# Patient Record
Sex: Female | Born: 1975 | Hispanic: No | Marital: Married | Smoking: Never smoker
Health system: Southern US, Community
[De-identification: ages and names within clinical notes are randomized; demographics above are authoritative.]

## PROBLEM LIST (undated history)

## (undated) ENCOUNTER — Inpatient Hospital Stay (HOSPITAL_COMMUNITY): Payer: Self-pay

## (undated) DIAGNOSIS — Z141 Cystic fibrosis carrier: Secondary | ICD-10-CM

## (undated) DIAGNOSIS — K029 Dental caries, unspecified: Secondary | ICD-10-CM

## (undated) HISTORY — DX: Cystic fibrosis carrier: Z14.1

## (undated) HISTORY — DX: Dental caries, unspecified: K02.9

---

## 2006-09-07 ENCOUNTER — Ambulatory Visit (HOSPITAL_COMMUNITY): Admission: RE | Admit: 2006-09-07 | Discharge: 2006-09-07 | Payer: Self-pay | Admitting: Family Medicine

## 2007-05-11 ENCOUNTER — Ambulatory Visit (HOSPITAL_COMMUNITY): Admission: RE | Admit: 2007-05-11 | Discharge: 2007-05-11 | Payer: Self-pay | Admitting: Family Medicine

## 2007-06-04 ENCOUNTER — Ambulatory Visit (HOSPITAL_COMMUNITY): Admission: RE | Admit: 2007-06-04 | Discharge: 2007-06-04 | Payer: Self-pay | Admitting: Family Medicine

## 2007-06-11 ENCOUNTER — Inpatient Hospital Stay (HOSPITAL_COMMUNITY): Admission: AD | Admit: 2007-06-11 | Discharge: 2007-06-11 | Payer: Self-pay | Admitting: Family Medicine

## 2007-06-18 ENCOUNTER — Ambulatory Visit (HOSPITAL_COMMUNITY): Admission: RE | Admit: 2007-06-18 | Discharge: 2007-06-18 | Payer: Self-pay | Admitting: Family Medicine

## 2007-10-30 ENCOUNTER — Ambulatory Visit (HOSPITAL_COMMUNITY): Admission: RE | Admit: 2007-10-30 | Discharge: 2007-10-30 | Payer: Self-pay | Admitting: Obstetrics & Gynecology

## 2007-11-20 ENCOUNTER — Ambulatory Visit: Payer: Self-pay | Admitting: Obstetrics & Gynecology

## 2007-11-22 ENCOUNTER — Ambulatory Visit: Payer: Self-pay | Admitting: Gynecology

## 2007-11-22 ENCOUNTER — Inpatient Hospital Stay (HOSPITAL_COMMUNITY): Admission: AD | Admit: 2007-11-22 | Discharge: 2007-11-24 | Payer: Self-pay | Admitting: Obstetrics & Gynecology

## 2007-11-26 ENCOUNTER — Inpatient Hospital Stay (HOSPITAL_COMMUNITY): Admission: AD | Admit: 2007-11-26 | Discharge: 2007-11-26 | Payer: Self-pay | Admitting: Gynecology

## 2007-11-26 ENCOUNTER — Ambulatory Visit: Payer: Self-pay | Admitting: Physician Assistant

## 2007-12-05 ENCOUNTER — Inpatient Hospital Stay (HOSPITAL_COMMUNITY): Admission: AD | Admit: 2007-12-05 | Discharge: 2007-12-05 | Payer: Self-pay | Admitting: Obstetrics & Gynecology

## 2007-12-05 ENCOUNTER — Ambulatory Visit: Payer: Self-pay | Admitting: Physician Assistant

## 2007-12-19 ENCOUNTER — Ambulatory Visit: Payer: Self-pay | Admitting: Obstetrics & Gynecology

## 2007-12-20 ENCOUNTER — Observation Stay (HOSPITAL_COMMUNITY): Admission: EM | Admit: 2007-12-20 | Discharge: 2007-12-21 | Payer: Self-pay | Admitting: Emergency Medicine

## 2007-12-20 ENCOUNTER — Encounter (INDEPENDENT_AMBULATORY_CARE_PROVIDER_SITE_OTHER): Payer: Self-pay | Admitting: Surgery

## 2008-01-14 HISTORY — PX: CHOLECYSTECTOMY: SHX55

## 2009-02-25 IMAGING — US US ABDOMEN COMPLETE
1 series · 14 of 25 positions shown · non-contrast
Comparison: None

CLINICAL DATA: Right upper quadrant pain.

ABDOMEN ULTRASOUND
TECHNIQUE: Complete abdominal ultrasound examination was performed
including evaluation of the liver, gallbladder, bile ducts,
pancreas, kidneys, spleen, IVC, and abdominal aorta.

[Series 1: unknown · 0.34mm/px · 14 of 72 slices shown]
[im 1/72]
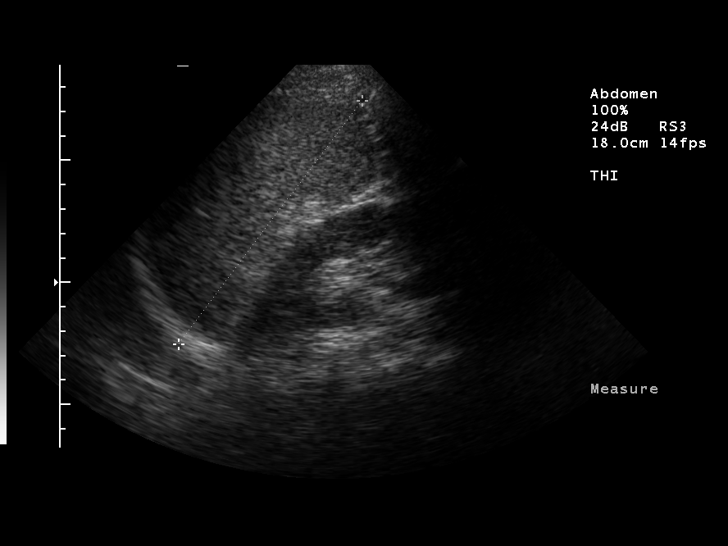
[im 6/72]
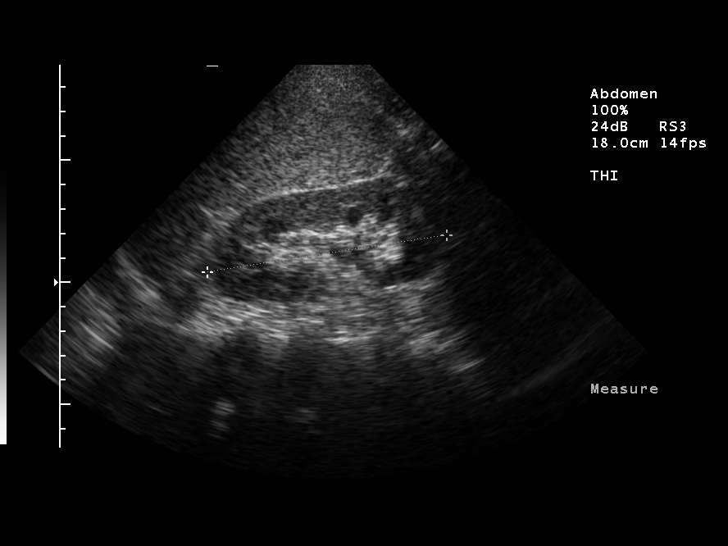
[im 12/72]
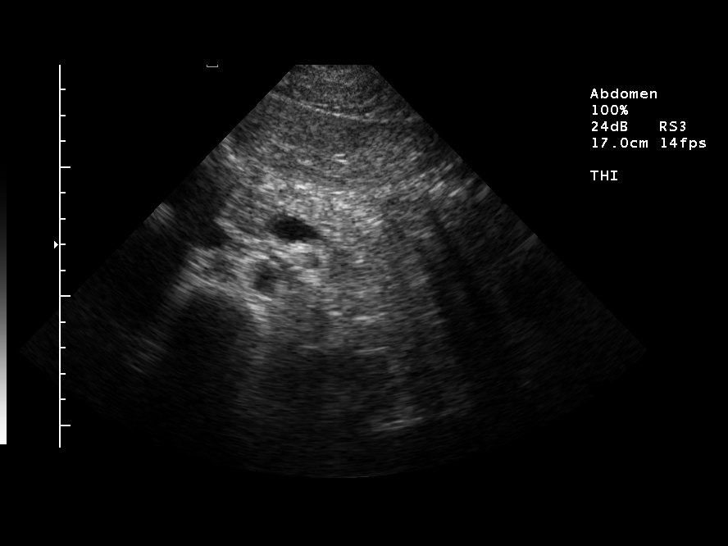
[im 18/72]
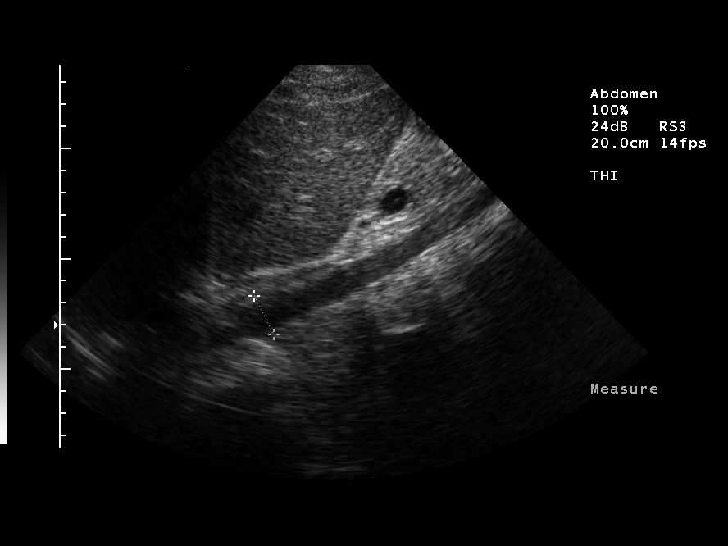
[im 24/72]
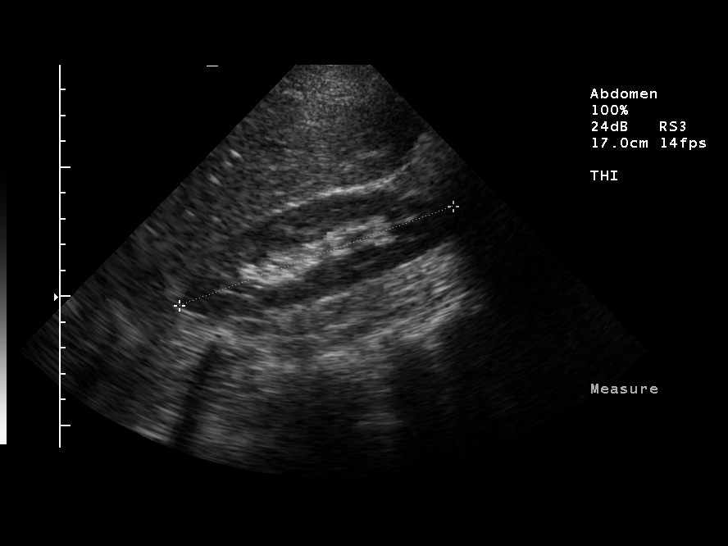
[im 27/72]
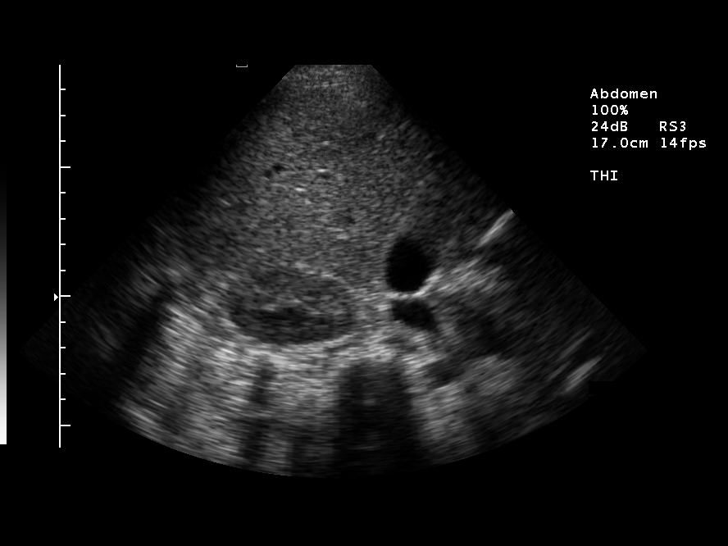
[im 33/72]
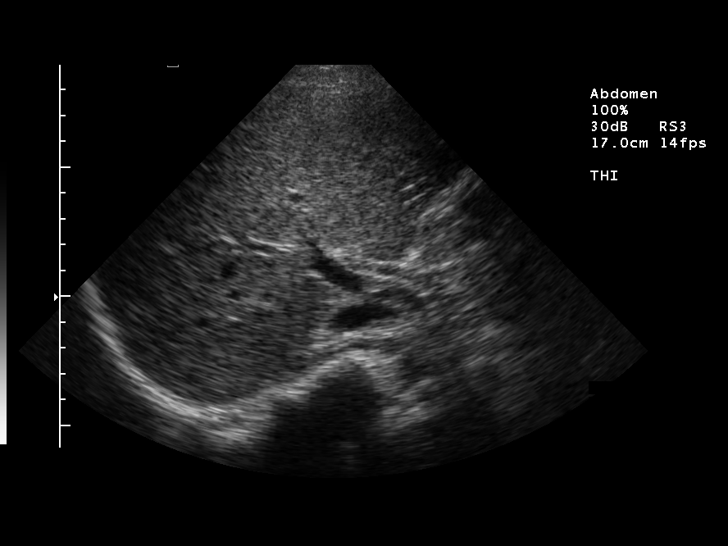
[im 39/72]
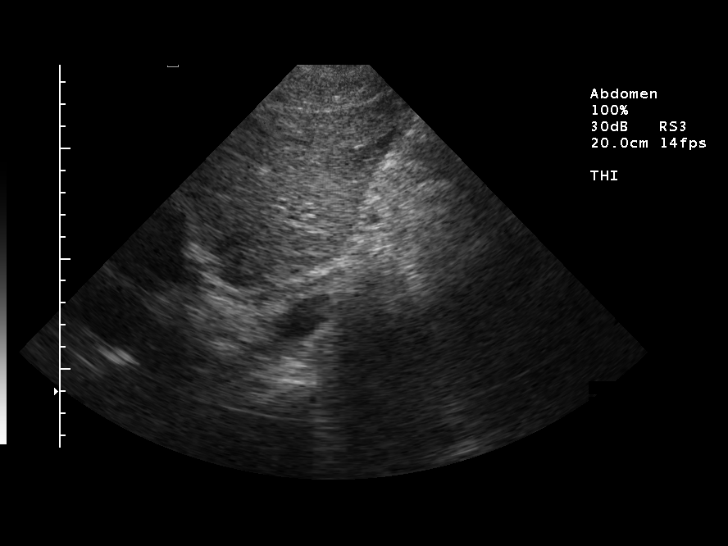
[im 45/72]
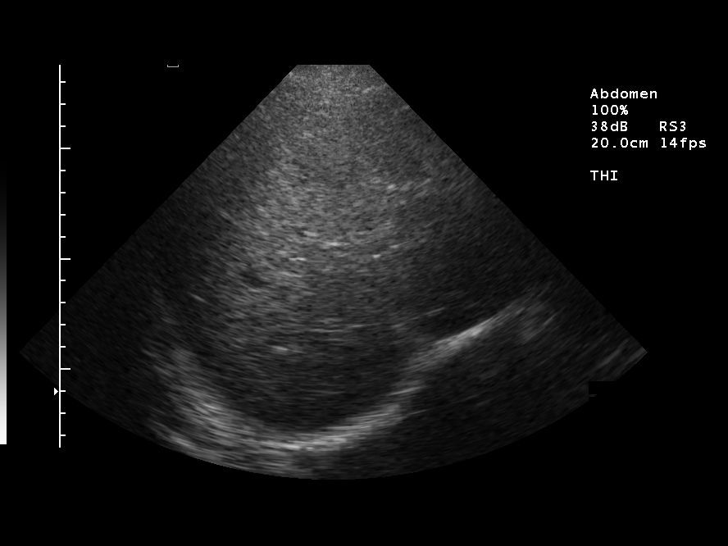
[im 48/72]
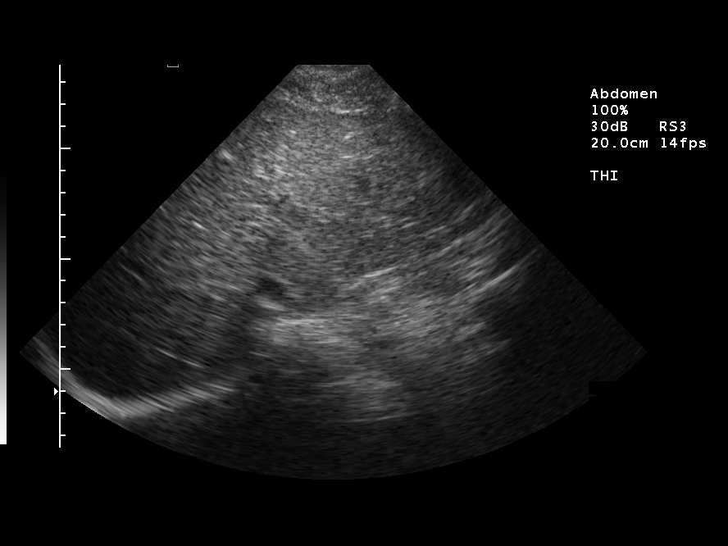
[im 54/72]
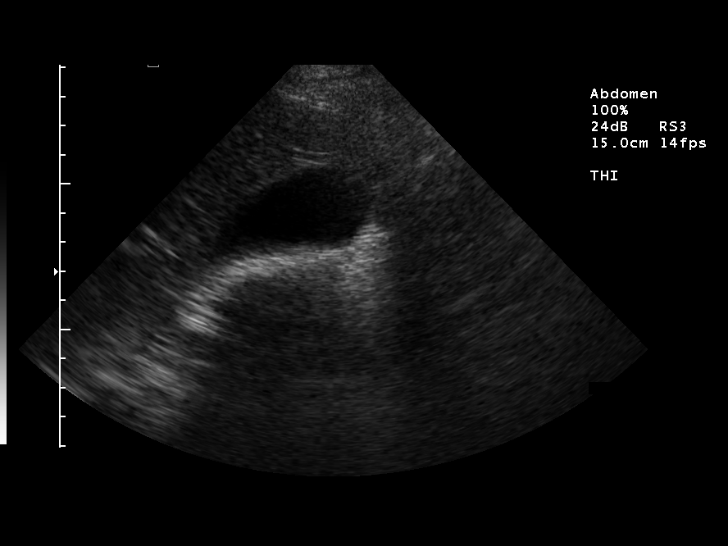
[im 60/72]
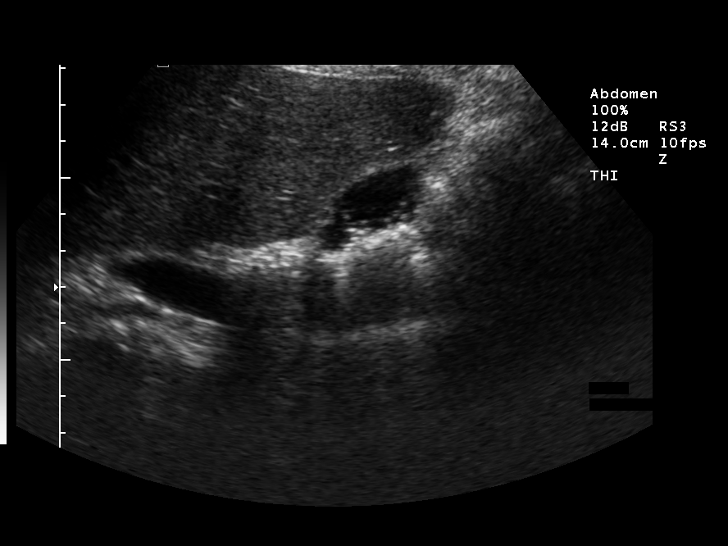
[im 66/72]
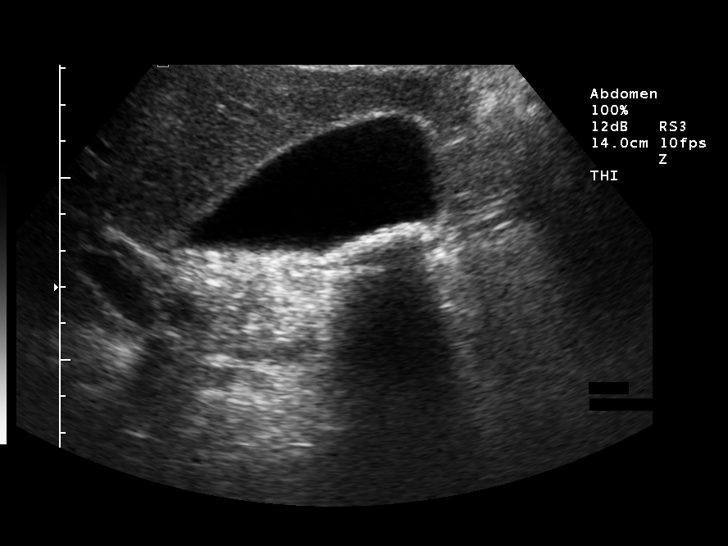
[im 72/72]
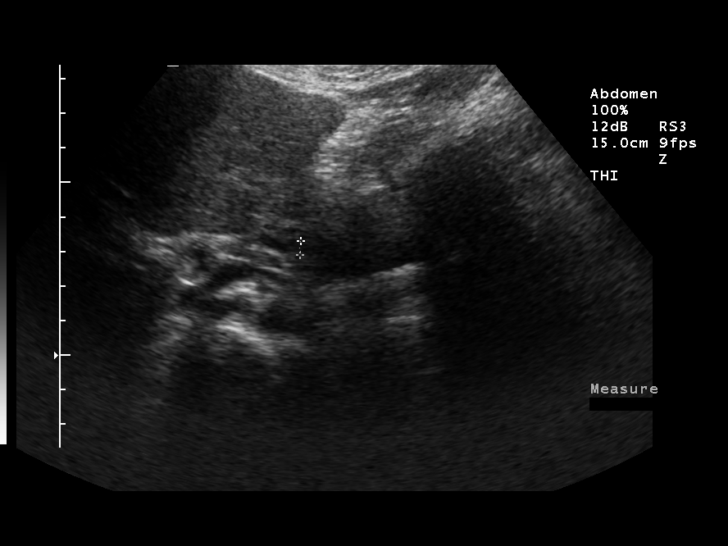

[14 of 25 positions shown; findings below may reference images not displayed]

FINDINGS: Multiple gallstones noted within the gallbladder.  These
are all small, difficult to measure as they are layering
posteriorly.  These are all less than 5 mm in size.  Gallbladder
wall borderline thickened at 3 mm.  The patient was tender over the
gallbladder during the exam.  Common bile duct normal at 5 mm.

Visualized liver, IVC, pancreas, spleen, kidneys, abdominal aorta
unremarkable.
IMPRESSION: Cholelithiasis.  Borderline thickened gallbladder wall with
tenderness over the gallbladder during exam.  Findings worrisome
for early acute cholecystitis.

## 2009-02-25 IMAGING — RF DG CHOLANGIOGRAM OPERATIVE
1 series · 8 of 8 positions shown · non-contrast
Comparison: None

CLINICAL DATA: Pancreatitis and cholelithiasis

INTRAOPERATIVE CHOLANGIOGRAM
TECHNIQUE: Multiple fluoroscopic spot radiographs were obtained
during intraoperative cholangiogram and are submitted for
interpretation post-operatively.

[Series 1: run · 2 acquisitions, 8 frames shown]
[im 1/2]
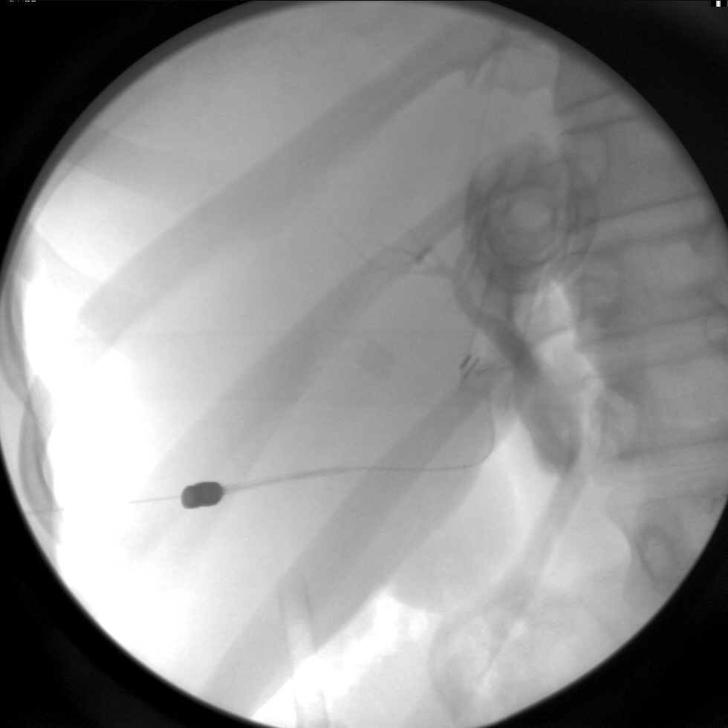
[im 1/2]
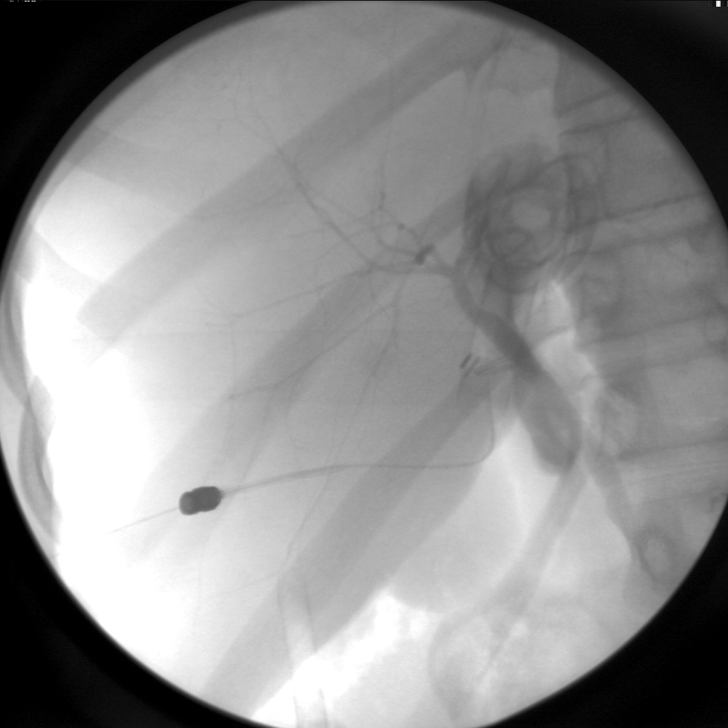
[im 1/2]
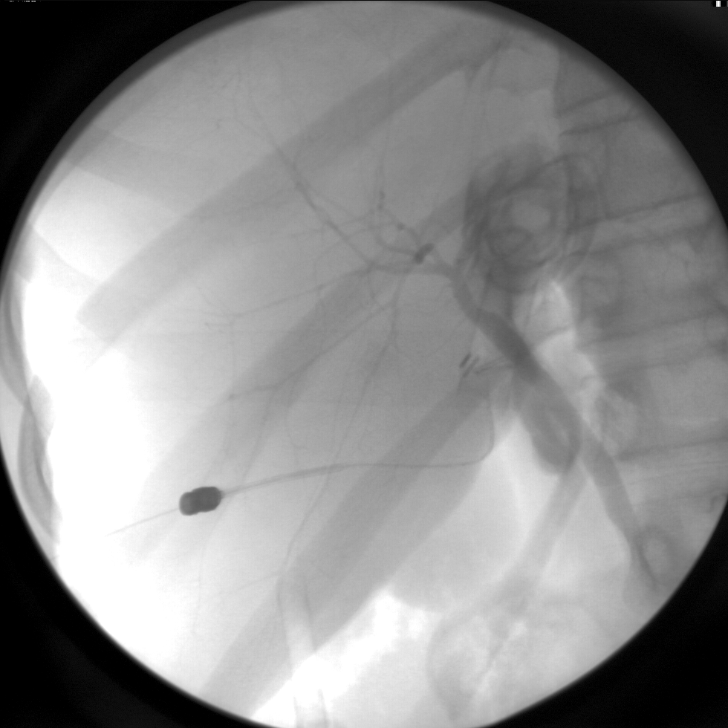
[im 1/2]
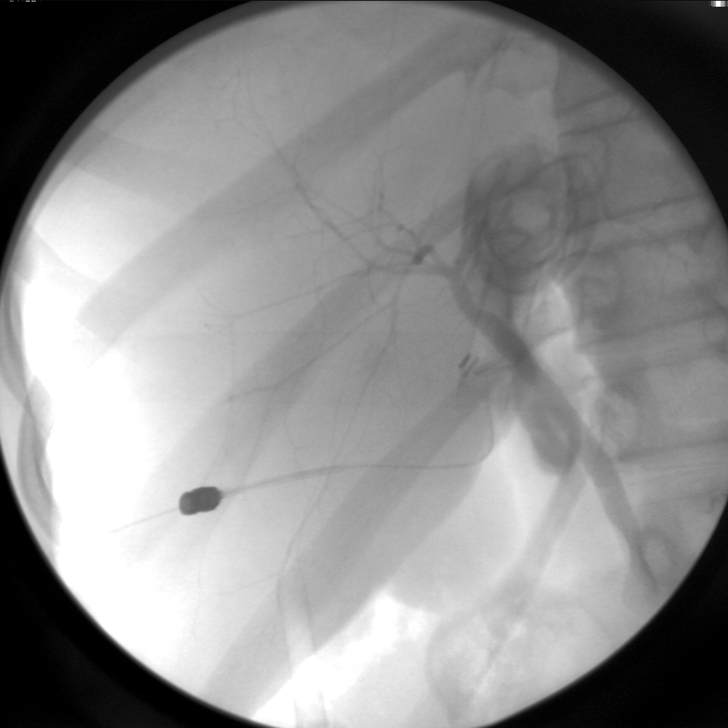
[im 2/2]
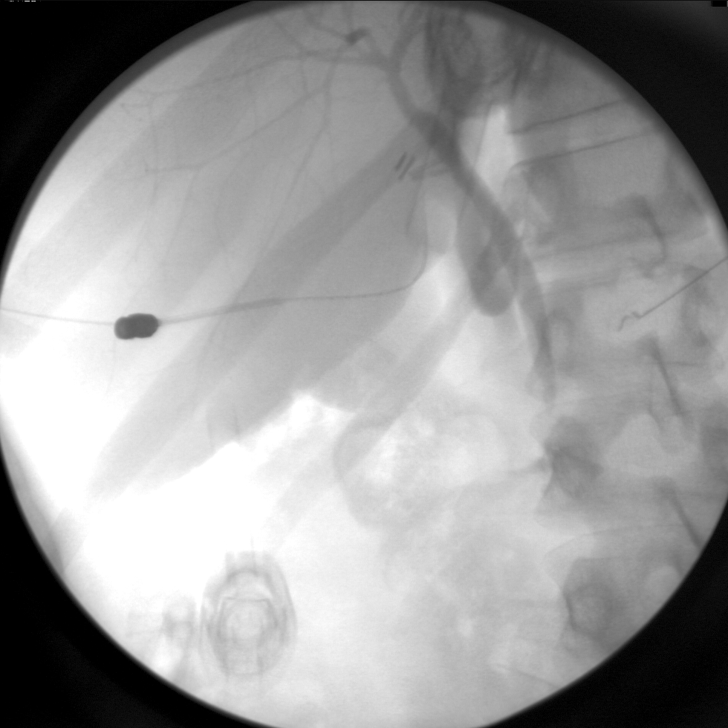
[im 2/2]
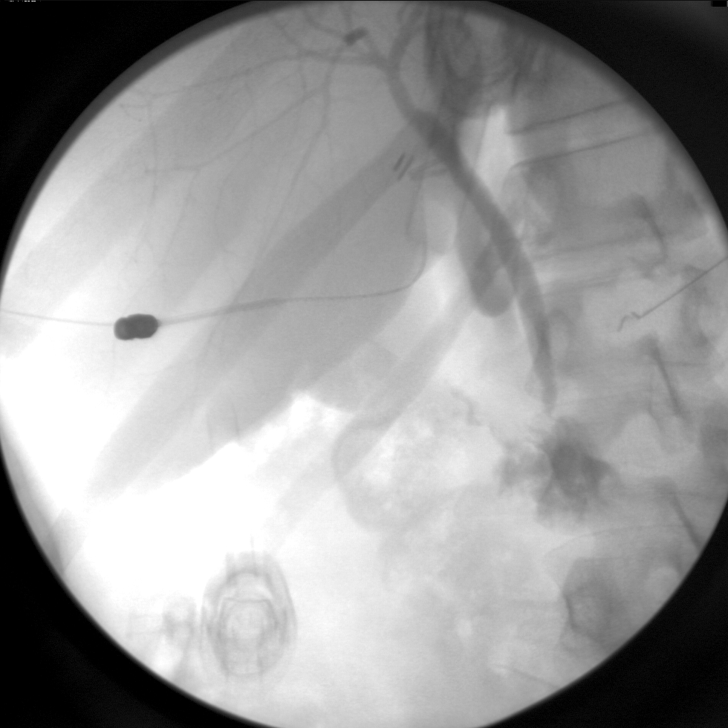
[im 2/2]
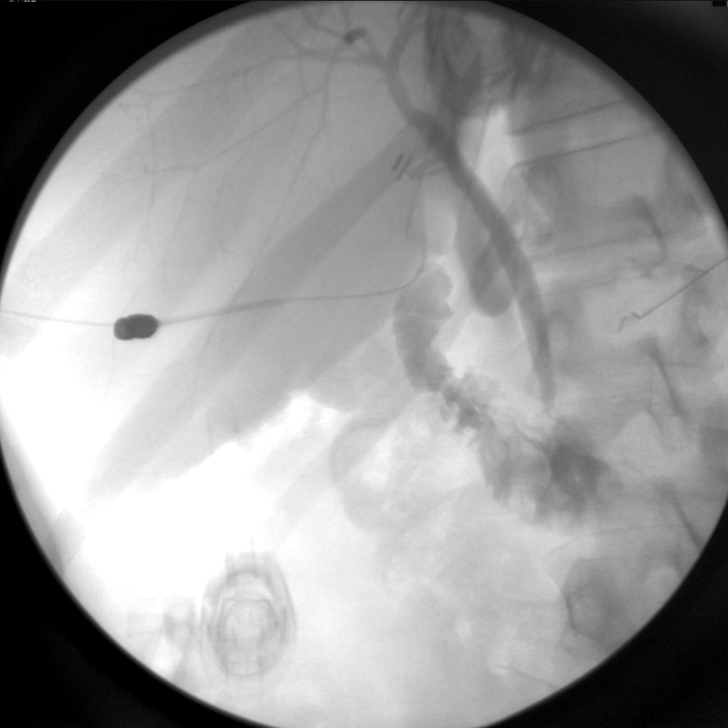
[im 2/2]
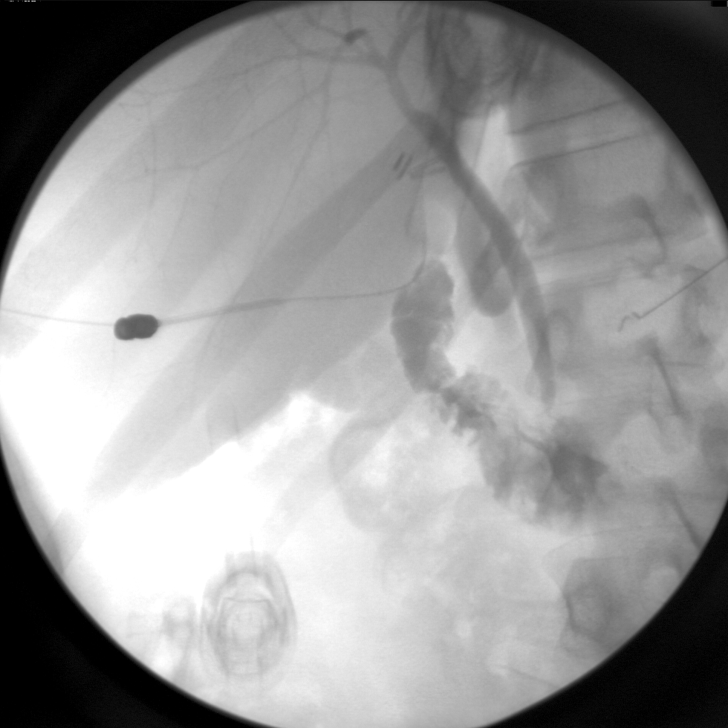

[8 of 8 positions shown; findings below may reference images not displayed]

FINDINGS: Contrast is seen filling the biliary tree and duodenum
compatible with patency.  No filling defects are seen in the common
duct to suggest bile duct stones.
IMPRESSION: Biliary system is patent.  No duct stones.

## 2010-02-09 ENCOUNTER — Encounter (INDEPENDENT_AMBULATORY_CARE_PROVIDER_SITE_OTHER): Payer: Self-pay | Admitting: *Deleted

## 2010-02-09 LAB — CONVERTED CEMR LAB
ALT: 8 units/L (ref 0–35)
Alkaline Phosphatase: 77 units/L (ref 39–117)
Basophils Absolute: 0 10*3/uL (ref 0.0–0.1)
Creatinine, Ser: 0.82 mg/dL (ref 0.40–1.20)
Eosinophils Absolute: 0.2 10*3/uL (ref 0.0–0.7)
Eosinophils Relative: 3 % (ref 0–5)
Glucose, Bld: 93 mg/dL (ref 70–99)
HCT: 44.9 % (ref 36.0–46.0)
LDL Cholesterol: 87 mg/dL (ref 0–99)
MCHC: 33.6 g/dL (ref 30.0–36.0)
MCV: 94.3 fL (ref 78.0–100.0)
Platelets: 231 10*3/uL (ref 150–400)
RDW: 12.2 % (ref 11.5–15.5)
Sodium: 139 meq/L (ref 135–145)
Total Bilirubin: 1.5 mg/dL — ABNORMAL HIGH (ref 0.3–1.2)
Total CHOL/HDL Ratio: 2.8
Total Protein: 6.7 g/dL (ref 6.0–8.3)
Triglycerides: 71 mg/dL (ref <150)
VLDL: 14 mg/dL (ref 0–40)

## 2010-05-07 ENCOUNTER — Encounter: Payer: Self-pay | Admitting: Obstetrics & Gynecology

## 2010-06-24 ENCOUNTER — Inpatient Hospital Stay (HOSPITAL_COMMUNITY)
Admission: AD | Admit: 2010-06-24 | Discharge: 2010-06-25 | Disposition: A | Discharge status: Home / Self Care | Payer: Medicaid Other | Source: Ambulatory Visit | Attending: Obstetrics | Admitting: Obstetrics

## 2010-06-24 DIAGNOSIS — O210 Mild hyperemesis gravidarum: Secondary | ICD-10-CM

## 2010-06-24 LAB — CBC
HCT: 38.7 % (ref 36.0–46.0)
Hemoglobin: 13.8 g/dL (ref 12.0–15.0)
MCH: 32 pg (ref 26.0–34.0)
MCHC: 35.7 g/dL (ref 30.0–36.0)
MCV: 89.8 fL (ref 78.0–100.0)
RDW: 12 % (ref 11.5–15.5)

## 2010-06-24 LAB — URINALYSIS, ROUTINE W REFLEX MICROSCOPIC
Bilirubin Urine: NEGATIVE
Glucose, UA: NEGATIVE mg/dL
Ketones, ur: NEGATIVE mg/dL
Protein, ur: NEGATIVE mg/dL

## 2010-06-25 ENCOUNTER — Inpatient Hospital Stay (HOSPITAL_COMMUNITY): Payer: Medicaid Other

## 2010-06-25 LAB — COMPREHENSIVE METABOLIC PANEL
Albumin: 3.3 g/dL — ABNORMAL LOW (ref 3.5–5.2)
BUN: 8 mg/dL (ref 6–23)
Calcium: 9 mg/dL (ref 8.4–10.5)
Creatinine, Ser: 1.02 mg/dL (ref 0.4–1.2)
Glucose, Bld: 89 mg/dL (ref 70–99)
Potassium: 3.6 mEq/L (ref 3.5–5.1)
Total Protein: 6.6 g/dL (ref 6.0–8.3)

## 2010-06-25 LAB — AMYLASE: Amylase: 56 U/L (ref 0–105)

## 2010-06-25 LAB — LIPASE, BLOOD: Lipase: 30 U/L (ref 11–59)

## 2010-07-10 LAB — RPR: RPR: NONREACTIVE

## 2010-07-10 LAB — ABO/RH

## 2010-07-10 LAB — HEPATITIS B SURFACE ANTIGEN: Hepatitis B Surface Ag: NEGATIVE

## 2010-07-10 LAB — SICKLE CELL SCREEN: Sickle Cell Screen: NEGATIVE

## 2010-07-10 LAB — ANTIBODY SCREEN: Antibody Screen: NEGATIVE

## 2010-07-19 LAB — GC/CHLAMYDIA PROBE AMP, GENITAL
Chlamydia: NEGATIVE
Gonorrhea: NEGATIVE

## 2010-08-28 NOTE — Op Note (Signed)
Renee Renee [name], Renee [name] NO.:  [id number]   MEDICAL RECORD NO.:  [id number]          PATIENT TYPE:  OBV   LOCATION:  1535                         FACILITY:  [facility]   PHYSICIAN:  Thornton Park. Daphine Deutscher, MD  DATE OF BIRTH:  Apr 17, 1975   DATE OF PROCEDURE:  12/20/2007  DATE OF DISCHARGE:  11/20/2007                               OPERATIVE REPORT   PREOPERATIVE INDICATIONS:  This is a 35 year old [place] female who is  4 weeks postpartum who is breast feeding, who 1 day prior to admission  started having abdominal pain after eating fish and cheese.  She went to  [facility] and was transferred back over to [place] and continued  to have persistent pain in her mid abdomen and had a gallbladder  ultrasound showing mild gallbladder wall thickening and gallstones.  She  was seen by me about 3:30 a.m. and we discussed admission for  cholecystitis.  Her initial amylase and lipase were both within normal  limits and subsequently another has come back showing her lipase was  slightly elevated at 70.  Informed consent was obtained regarding  laparoscopic as well as open cholecystectomy.   PROCEDURES:  Laparoscopic cholecystectomy with intraoperative  cholangiogram.   SURGEON:  Luretha Murphy, MD.   ASSISTANT:  Almond Lint, MD.   ANESTHESIA:  General endotracheal.   DESCRIPTION OF PROCEDURE:  Renee Renee [name] was taken to the OR on Sunday,  December 20, 2007 and given general anesthesia.  The abdomen was prepped  with Techni-Care and draped sterilely.  I cut down into her umbilicus in  a longitudinal fashion, entering a small umbilical hernia and then  opened that up to pass my Hasson cannula.  The abdomen was insufflated.  The gallbladder could be seen and there was not a lot of erythema around  it but there were some adhesions to the neck when I initially exposed  it.  I therefore went ahead and did a survey of the abdomen, looked at  her stomach, and everything looked to be  in order.  She had a paucity of  fat, so I could see things pretty well and I tracked down on the right  side and identified a very normal, free moving appendix without evidence  of appendicitis.  The terminal ileum looked fine and no other  abnormalities were noted in her abdomen.  A total of 4 trocar sites were  used; a 3 in the upper abdomen, two 5s laterally on the right, and one  10/11 in the upper midline.  The gallbladder was retracted and I took  down these adhesions to the neck of the gallbladder.  Once taken down, I  identified Calot's triangle and got a critical view.  I put a clip up on  the gallbladder and incised the cystic duct,  put in a Reddick catheter,  took a dynamic cholangiogram which showed good intrahepatic filling and  free flow into the duodenum.  There was no pancreatic duct filling  noted.  There was some kind of an artifact back posteriorly that was  there prior to even doing  the study.  I then triple clipped the cystic  duct, divided it, and then I clipped the cystic artery, divided it, then  I removed the gallbladder from the gallbladder bed with hook  electrocautery without difficulty.  I did not enter the gallbladder and  went ahead and once it was detached, placed it in a bag and brought it  out through the umbilicus.  The gallbladder bed was inspected and no  bleeding or bile leaks were noted.  I irrigated it well and then drew  off pressure so that I could see it again and nothing was noted.  The  port sites were all injected with some Marcaine.  I repaired the  umbilical defect with a figure-of-eight suture and a  simple suture under laparoscopic vision.  I then removed all the  trocars, decompressed the abdomen, and closed the skin with 4-0 Vicryl,  benzoin, and Steri-Strips.  The patient seemed to tolerated procedure  well and was taken to the recovery room in satisfactory condition.      Thornton Park Daphine Deutscher, MD  Electronically Signed      MBM/MEDQ  D:  12/20/2007  T:  12/21/2007  Job:  [id number]

## 2010-10-02 ENCOUNTER — Inpatient Hospital Stay (HOSPITAL_COMMUNITY)
Admission: AD | Admit: 2010-10-02 | Discharge: 2010-10-03 | Disposition: A | Discharge status: Home / Self Care | Payer: Medicaid Other | Source: Ambulatory Visit | Attending: Obstetrics | Admitting: Obstetrics

## 2010-10-02 DIAGNOSIS — O99891 Other specified diseases and conditions complicating pregnancy: Secondary | ICD-10-CM | POA: Insufficient documentation

## 2010-10-02 DIAGNOSIS — R05 Cough: Secondary | ICD-10-CM | POA: Insufficient documentation

## 2010-10-02 DIAGNOSIS — Z79899 Other long term (current) drug therapy: Secondary | ICD-10-CM | POA: Insufficient documentation

## 2010-10-02 DIAGNOSIS — R059 Cough, unspecified: Secondary | ICD-10-CM | POA: Insufficient documentation

## 2010-10-03 ENCOUNTER — Emergency Department (HOSPITAL_COMMUNITY): Payer: Medicaid Other

## 2010-10-03 ENCOUNTER — Emergency Department (HOSPITAL_COMMUNITY)
Admission: EM | Admit: 2010-10-03 | Discharge: 2010-10-03 | Disposition: A | Discharge status: Home / Self Care | Payer: Medicaid Other | Attending: Emergency Medicine | Admitting: Emergency Medicine

## 2010-10-03 DIAGNOSIS — R05 Cough: Secondary | ICD-10-CM | POA: Insufficient documentation

## 2010-10-03 DIAGNOSIS — O99891 Other specified diseases and conditions complicating pregnancy: Secondary | ICD-10-CM | POA: Insufficient documentation

## 2010-10-03 DIAGNOSIS — R059 Cough, unspecified: Secondary | ICD-10-CM | POA: Insufficient documentation

## 2010-10-17 ENCOUNTER — Inpatient Hospital Stay (HOSPITAL_COMMUNITY)
Admission: AD | Admit: 2010-10-17 | Discharge: 2010-10-17 | Disposition: A | Discharge status: Home / Self Care | Payer: Medicaid Other | Source: Ambulatory Visit | Attending: Obstetrics | Admitting: Obstetrics

## 2010-10-17 DIAGNOSIS — B9689 Other specified bacterial agents as the cause of diseases classified elsewhere: Secondary | ICD-10-CM | POA: Insufficient documentation

## 2010-10-17 DIAGNOSIS — A499 Bacterial infection, unspecified: Secondary | ICD-10-CM | POA: Insufficient documentation

## 2010-10-17 DIAGNOSIS — O4690 Antepartum hemorrhage, unspecified, unspecified trimester: Secondary | ICD-10-CM | POA: Insufficient documentation

## 2010-10-17 DIAGNOSIS — O2390 Unspecified genitourinary tract infection in pregnancy, unspecified trimester: Secondary | ICD-10-CM | POA: Insufficient documentation

## 2010-10-17 DIAGNOSIS — N760 Acute vaginitis: Secondary | ICD-10-CM | POA: Insufficient documentation

## 2010-10-17 LAB — WET PREP, GENITAL
Trich, Wet Prep: NONE SEEN
Yeast Wet Prep HPF POC: NONE SEEN

## 2010-10-17 LAB — URINALYSIS, ROUTINE W REFLEX MICROSCOPIC
Nitrite: NEGATIVE
Protein, ur: NEGATIVE mg/dL
Specific Gravity, Urine: 1.01 (ref 1.005–1.030)
Urobilinogen, UA: 0.2 mg/dL (ref 0.0–1.0)

## 2010-12-07 LAB — STREP B DNA PROBE: GBS: NEGATIVE

## 2010-12-28 ENCOUNTER — Encounter (HOSPITAL_COMMUNITY): Payer: Self-pay | Admitting: *Deleted

## 2010-12-28 ENCOUNTER — Inpatient Hospital Stay (HOSPITAL_COMMUNITY)
Admission: AD | Admit: 2010-12-28 | Discharge: 2010-12-29 | Disposition: A | Discharge status: Home / Self Care | Payer: Medicaid Other | Source: Ambulatory Visit | Attending: Obstetrics & Gynecology | Admitting: Obstetrics & Gynecology

## 2010-12-28 DIAGNOSIS — O479 False labor, unspecified: Secondary | ICD-10-CM | POA: Insufficient documentation

## 2010-12-28 NOTE — Progress Notes (Signed)
Pt states, " I've had contractions all day, but they have been stronger since 6pm. They are every 10 min more, and I have a bloody discharge."

## 2010-12-31 ENCOUNTER — Telehealth (HOSPITAL_COMMUNITY): Payer: Self-pay | Admitting: *Deleted

## 2010-12-31 NOTE — Telephone Encounter (Signed)
Preadmission screen  

## 2011-01-01 ENCOUNTER — Encounter (HOSPITAL_COMMUNITY): Payer: Self-pay | Admitting: *Deleted

## 2011-01-02 ENCOUNTER — Inpatient Hospital Stay (HOSPITAL_COMMUNITY)
Admission: RE | Admit: 2011-01-02 | Discharge: 2011-01-06 | DRG: 765 | Disposition: A | Discharge status: Home / Self Care | Payer: Medicaid Other | Source: Ambulatory Visit | Attending: Obstetrics & Gynecology | Admitting: Obstetrics & Gynecology

## 2011-01-02 ENCOUNTER — Encounter (HOSPITAL_COMMUNITY): Payer: Self-pay

## 2011-01-02 DIAGNOSIS — D62 Acute posthemorrhagic anemia: Secondary | ICD-10-CM | POA: Diagnosis not present

## 2011-01-02 DIAGNOSIS — O480 Post-term pregnancy: Secondary | ICD-10-CM | POA: Diagnosis present

## 2011-01-02 DIAGNOSIS — O411090 Infection of amniotic sac and membranes, unspecified, unspecified trimester, not applicable or unspecified: Secondary | ICD-10-CM | POA: Diagnosis present

## 2011-01-02 DIAGNOSIS — O3660X0 Maternal care for excessive fetal growth, unspecified trimester, not applicable or unspecified: Secondary | ICD-10-CM | POA: Diagnosis present

## 2011-01-02 DIAGNOSIS — O411290 Chorioamnionitis, unspecified trimester, not applicable or unspecified: Secondary | ICD-10-CM | POA: Diagnosis not present

## 2011-01-02 DIAGNOSIS — O9903 Anemia complicating the puerperium: Secondary | ICD-10-CM | POA: Diagnosis not present

## 2011-01-02 LAB — CBC
HCT: 39.1 % (ref 36.0–46.0)
Hemoglobin: 13.4 g/dL (ref 12.0–15.0)
MCHC: 34.3 g/dL (ref 30.0–36.0)
RBC: 4.14 MIL/uL (ref 3.87–5.11)
WBC: 14 10*3/uL — ABNORMAL HIGH (ref 4.0–10.5)

## 2011-01-02 LAB — RPR: RPR Ser Ql: NONREACTIVE

## 2011-01-02 MED ORDER — PENICILLIN G POTASSIUM 5000000 UNITS IJ SOLR
5.0000 10*6.[IU] | Freq: Once | INTRAVENOUS | Status: DC
  Filled 2011-01-02: qty 5

## 2011-01-02 MED ORDER — EPHEDRINE 5 MG/ML INJ
10.0000 mg | INTRAVENOUS | Status: DC | PRN
  Filled 2011-01-02 (×2): qty 4

## 2011-01-02 MED ORDER — CLINDAMYCIN PHOSPHATE 900 MG/50ML IV SOLN: 900.0000 mg | Freq: Once | INTRAVENOUS | Status: DC

## 2011-01-02 MED ORDER — EPHEDRINE 5 MG/ML INJ
10.0000 mg | INTRAVENOUS | Status: DC | PRN
  Filled 2011-01-02: qty 4

## 2011-01-02 MED ORDER — PHENYLEPHRINE 40 MCG/ML (10ML) SYRINGE FOR IV PUSH (FOR BLOOD PRESSURE SUPPORT)
80.0000 ug | PREFILLED_SYRINGE | INTRAVENOUS | Status: DC | PRN
  Filled 2011-01-02 (×2): qty 5

## 2011-01-02 MED ORDER — OXYTOCIN 20 UNITS IN LACTATED RINGERS INFUSION - SIMPLE: 125.0000 mL/h | Freq: Once | INTRAVENOUS | Status: DC

## 2011-01-02 MED ORDER — OXYTOCIN BOLUS FROM INFUSION
500.0000 mL | Freq: Once | INTRAVENOUS | Status: DC
  Filled 2011-01-02: qty 500

## 2011-01-02 MED ORDER — DIPHENHYDRAMINE HCL 50 MG/ML IJ SOLN: 12.5000 mg | INTRAMUSCULAR | Status: DC | PRN

## 2011-01-02 MED ORDER — OXYCODONE-ACETAMINOPHEN 5-325 MG PO TABS: 2.0000 | ORAL_TABLET | ORAL | Status: DC | PRN

## 2011-01-02 MED ORDER — ONDANSETRON HCL 4 MG/2ML IJ SOLN
4.0000 mg | Freq: Four times a day (QID) | INTRAMUSCULAR | Status: DC | PRN
  Administered 2011-01-03: 4 mg via INTRAVENOUS
  Filled 2011-01-02: qty 2

## 2011-01-02 MED ORDER — FENTANYL 2.5 MCG/ML BUPIVACAINE 1/10 % EPIDURAL INFUSION (WH - ANES)
14.0000 mL/h | INTRAMUSCULAR | Status: DC
  Administered 2011-01-02 – 2011-01-03 (×3): 14 mL/h via EPIDURAL
  Filled 2011-01-02 (×3): qty 60

## 2011-01-02 MED ORDER — HYDROXYZINE HCL 50 MG/ML IM SOLN: 50.0000 mg | Freq: Four times a day (QID) | INTRAMUSCULAR | Status: DC | PRN

## 2011-01-02 MED ORDER — IBUPROFEN 600 MG PO TABS: 600.0000 mg | ORAL_TABLET | Freq: Four times a day (QID) | ORAL | Status: DC | PRN

## 2011-01-02 MED ORDER — HYDROXYZINE HCL 50 MG PO TABS: 50.0000 mg | ORAL_TABLET | Freq: Four times a day (QID) | ORAL | Status: DC | PRN

## 2011-01-02 MED ORDER — CITRIC ACID-SODIUM CITRATE 334-500 MG/5ML PO SOLN
30.0000 mL | ORAL | Status: DC | PRN
  Administered 2011-01-03: 30 mL via ORAL
  Filled 2011-01-02: qty 15

## 2011-01-02 MED ORDER — ACETAMINOPHEN 325 MG PO TABS
650.0000 mg | ORAL_TABLET | ORAL | Status: DC | PRN
  Administered 2011-01-03: 650 mg via ORAL
  Filled 2011-01-02: qty 2

## 2011-01-02 MED ORDER — BUTORPHANOL TARTRATE 2 MG/ML IJ SOLN
1.0000 mg | INTRAMUSCULAR | Status: DC | PRN
  Administered 2011-01-02: 1 mg via INTRAVENOUS
  Filled 2011-01-02: qty 1

## 2011-01-02 MED ORDER — PHENYLEPHRINE 40 MCG/ML (10ML) SYRINGE FOR IV PUSH (FOR BLOOD PRESSURE SUPPORT)
80.0000 ug | PREFILLED_SYRINGE | INTRAVENOUS | Status: DC | PRN
  Filled 2011-01-02: qty 5

## 2011-01-02 MED ORDER — TERBUTALINE SULFATE 1 MG/ML IJ SOLN: 0.2500 mg | Freq: Once | INTRAMUSCULAR | Status: AC | PRN

## 2011-01-02 MED ORDER — SODIUM BICARBONATE 8.4 % IV SOLN
INTRAVENOUS | Status: DC | PRN
  Administered 2011-01-02: 4 mL via EPIDURAL

## 2011-01-02 MED ORDER — LIDOCAINE HCL (PF) 1 % IJ SOLN
30.0000 mL | INTRAMUSCULAR | Status: DC | PRN
  Filled 2011-01-02 (×2): qty 30

## 2011-01-02 MED ORDER — LACTATED RINGERS IV SOLN: 500.0000 mL | Freq: Once | INTRAVENOUS | Status: DC

## 2011-01-02 MED ORDER — LACTATED RINGERS IV SOLN
INTRAVENOUS | Status: DC
  Administered 2011-01-02 (×2): via INTRAVENOUS
  Administered 2011-01-02: 500 mL/h via INTRAVENOUS
  Administered 2011-01-03 (×2): via INTRAVENOUS
  Administered 2011-01-03: 125 mL/h via INTRAVENOUS

## 2011-01-02 MED ORDER — LACTATED RINGERS IV SOLN: INTRAVENOUS | Status: DC

## 2011-01-02 MED ORDER — PENICILLIN G POTASSIUM 5000000 UNITS IJ SOLR
2.5000 10*6.[IU] | INTRAMUSCULAR | Status: DC
  Filled 2011-01-02 (×2): qty 2.5

## 2011-01-02 MED ORDER — LACTATED RINGERS IV SOLN
500.0000 mL | INTRAVENOUS | Status: DC | PRN
  Administered 2011-01-02 – 2011-01-03 (×2): 500 mL via INTRAVENOUS

## 2011-01-02 MED ORDER — OXYTOCIN 20 UNITS IN LACTATED RINGERS INFUSION - SIMPLE
1.0000 m[IU]/min | INTRAVENOUS | Status: DC
  Administered 2011-01-02: 2 m[IU]/min via INTRAVENOUS
  Administered 2011-01-02: 6 m[IU]/min via INTRAVENOUS
  Filled 2011-01-02: qty 1000

## 2011-01-02 NOTE — Anesthesia Procedure Notes (Signed)
Epidural Patient location during procedure: OB Start time: 01/02/2011 8:27 PM  Staffing Anesthesiologist: Jiles Garter  Preanesthetic Checklist Completed: patient identified, site marked, surgical consent, pre-op evaluation, timeout performed, IV checked, risks and benefits discussed and monitors and equipment checked  Epidural Patient position: sitting Prep: site prepped and draped and DuraPrep Patient monitoring: continuous pulse ox and blood pressure Approach: midline Injection technique: LOR air  Needle:  Needle type: Tuohy  Needle gauge: 17 G Needle length: 9 cm Needle insertion depth: 6 cm Catheter type: closed end flexible Catheter size: 19 Gauge Catheter at skin depth: 12 cm Test dose: negative  Assessment Events: blood not aspirated, injection not painful, no injection resistance, negative IV test and no paresthesia  Additional Notes Dosing of Epidural: 1st dose, through needle...... ( mg expressed as equavilent  cc's medication  from .1%Bupiv / fentanyl syringe from L&D pump)...............  5mg  Marcaine  2nd dose, through catheter after waiting 3 minutes.... epi 1:200K + Xylocaine 40 mg 3rd dose, through catheter, after waiting 3 minutes...Marland KitchenMarland Kitchenepi 1:200K + Xylocaine 40 mg ( 2% Xylo charted as a single dose in Epic Meds for ease of charting; actual dosing was fractionated as above, for saftey's sake)  As each dose occurred, patient was free of IV sx; and patient exhibited no evidence of SA injection.  Patient is more comfortable after epidural dosed. Please see RN's note for documentation of vital signs,and FHR which are stable.

## 2011-01-02 NOTE — Plan of Care (Signed)
Problem: Consults Goal: Birthing Suites Patient Information Press F2 to bring up selections list Outcome: Completed/Met Date Met:  01/02/11  Pt > [redacted] weeks EGA

## 2011-01-02 NOTE — Progress Notes (Addendum)
Dr. Tamela Oddi notified of fhr tracing, uc pattern, and sve.  No orders received.

## 2011-01-02 NOTE — Anesthesia Preprocedure Evaluation (Addendum)
Anesthesia Evaluation  Name, MR# and DOB Patient awake  General Assessment Comment  Reviewed: Allergy & Precautions, H&P , Patient's Chart, lab work & pertinent test results  Airway Mallampati: II TM Distance: >3 FB Neck ROM: full    Dental  (+) Teeth Intact   Pulmonary  clear to auscultation  breath sounds clear to auscultation none    Cardiovascular regular Normal    Neuro/Psych   GI/Hepatic/Renal   Endo/Other  (+)   Morbid obesity  Abdominal   Musculoskeletal   Hematology   Peds  Reproductive/Obstetrics (+) Pregnancy    Anesthesia Other Findings                Anesthesia Physical Anesthesia Plan  ASA: III  Anesthesia Plan: Epidural   Post-op Pain Management:    Induction:   Airway Management Planned:   Additional Equipment:   Intra-op Plan:   Post-operative Plan:   Informed Consent: I have reviewed the patients History and Physical, chart, labs and discussed the procedure including the risks, benefits and alternatives for the proposed anesthesia with the patient or authorized representative who has indicated his/her understanding and acceptance.   Dental Advisory Given  Plan Discussed with: CRNA and Surgeon  Anesthesia Plan Comments: (Labs checked- platelets confirmed with RN in room. Fetal heart tracing, per RN, reportedly stable enough for sitting procedure. Discussed epidural, and patient consents to the procedure:  included risk of possible headache,backache, failed block, allergic reaction, and nerve injury. This patient was asked if she had any questions or concerns before the procedure started. )       Anesthesia Quick Evaluation  

## 2011-01-02 NOTE — H&P (Signed)
Renee [name] is a 35 y.o. female presenting for IOL secondary to post term. Maternal Medical History:  Reason for admission: IOL  Prenatal complications: no prenatal complications   OB History    Grav Para Term Preterm Abortions TAB SAB Ect Mult Living   3 1 1  1  1   1      History reviewed. No pertinent past medical history. Past Surgical History  Procedure Date  . Cholecystectomy 01/2008   Family History: family history is not on file. Social History:  reports that she has never smoked. She has never used smokeless tobacco. She reports that she does not drink alcohol or use illicit drugs.  Review of Systems  Constitutional: Negative for fever.  Eyes: Negative for blurred vision.  Respiratory: Negative for shortness of breath.   Gastrointestinal: Negative for vomiting.  Skin: Negative for rash.  Neurological: Negative for headaches.    Dilation: 4 Effacement (%): 70 Station: -2 Exam by:: jackson-moore Temperature 98.2 F (36.8 C), temperature source Oral, resp. rate 20, weight 89.812 kg (198 lb), last menstrual period 03/21/2010. Maternal Exam:  Uterine Assessment: Contraction strength is mild.  Contraction frequency is irregular.   Abdomen: Patient reports no abdominal tenderness. Fetal presentation: vertex  Introitus: not evaluated.     Fetal Exam Fetal Monitor Review: Variability: moderate (6-25 bpm).   Pattern: accelerations present and no decelerations.    Fetal State Assessment: Category I - tracings are normal.     Physical Exam  Constitutional: She appears well-developed.  HENT:  Head: Normocephalic.  Neck: Neck supple. No thyromegaly present.  Cardiovascular: Normal rate and regular rhythm.   Respiratory: Breath sounds normal.  GI: Soft. Bowel sounds are normal.  Skin: No rash noted.    Prenatal labs: ABO, Rh: O/Positive/-- (03/27 0000) Antibody: Negative (03/27 0000) Rubella:   RPR: Nonreactive (06/26 0000)  HBsAg: Negative (03/27 0000)    HIV: Non-reactive (06/26 0000)  GBS: Negative (08/24 0000)   Assessment/Plan: 35 y.o. w/an IUP at [redacted]w[redacted]d for IOL secondary to a post term pregnancy  Admit IOL   JACKSON-MOORE,Soliyana Mcchristian A 01/02/2011, 8:43 AM

## 2011-01-02 NOTE — Progress Notes (Signed)
Renee [name] is a 35 y.o. G3P1011 at [redacted]w[redacted]d by LMP admitted for induction of labor due to Post dates. Due date 9/12.  Subjective: Comfortable  Objective: BP 117/73  Pulse 86  Temp(Src) 98 F (36.7 C) (Oral)  Resp 20  Wt 89.812 kg (198 lb)  LMP 03/21/2010      FHT:  FHR: 140 bpm, variability: moderate,  accelerations:  Present,  decelerations:  Absent UC:   irregular, every 4 minutes SVE:   Dilation: 3.5 Effacement (%): 70 Station: -2 Exam by:: jackson-moore (IUPC placed)  Labs: Lab Results  Component Value Date   WBC 14.0* 01/02/2011   HGB 13.4 01/02/2011   HCT 39.1 01/02/2011   MCV 94.4 01/02/2011   PLT 133* 01/02/2011    Assessment / Plan: Undergoing IOL/prodromal labor   Fetal Wellbeing:  Category I Pain Control:  Labor support without medications I/D:  n/a Anticipated MOD:  NSVD  JACKSON-MOORE,Chalsea Darko A 01/02/2011, 5:42 PM

## 2011-01-03 ENCOUNTER — Encounter (HOSPITAL_COMMUNITY): Payer: Self-pay | Admitting: Anesthesiology

## 2011-01-03 ENCOUNTER — Inpatient Hospital Stay (HOSPITAL_COMMUNITY): Payer: Medicaid Other | Admitting: Anesthesiology

## 2011-01-03 ENCOUNTER — Other Ambulatory Visit: Payer: Self-pay | Admitting: Obstetrics & Gynecology

## 2011-01-03 ENCOUNTER — Encounter (HOSPITAL_COMMUNITY)
Admission: RE | Disposition: A | Discharge status: Home / Self Care | Payer: Self-pay | Source: Ambulatory Visit | Attending: Obstetrics & Gynecology

## 2011-01-03 DIAGNOSIS — O411290 Chorioamnionitis, unspecified trimester, not applicable or unspecified: Secondary | ICD-10-CM | POA: Diagnosis not present

## 2011-01-03 SURGERY — Surgical Case
Anesthesia: Epidural | Site: Abdomen | Wound class: Clean Contaminated

## 2011-01-03 MED ORDER — MEDROXYPROGESTERONE ACETATE 150 MG/ML IM SUSP: 150.0000 mg | INTRAMUSCULAR | Status: DC | PRN

## 2011-01-03 MED ORDER — WITCH HAZEL-GLYCERIN EX PADS: 1.0000 "application " | MEDICATED_PAD | CUTANEOUS | Status: DC | PRN

## 2011-01-03 MED ORDER — SCOPOLAMINE 1 MG/3DAYS TD PT72
1.0000 | MEDICATED_PATCH | Freq: Once | TRANSDERMAL | Status: AC
  Administered 2011-01-03: 1.5 mg via TRANSDERMAL

## 2011-01-03 MED ORDER — LACTATED RINGERS IV SOLN
INTRAVENOUS | Status: DC | PRN
  Administered 2011-01-03: 05:00:00 via INTRAVENOUS

## 2011-01-03 MED ORDER — ONDANSETRON HCL 4 MG/2ML IJ SOLN
INTRAMUSCULAR | Status: DC | PRN
  Administered 2011-01-03: 4 mg via INTRAVENOUS

## 2011-01-03 MED ORDER — MEPERIDINE HCL 25 MG/ML IJ SOLN: 6.2500 mg | INTRAMUSCULAR | Status: DC | PRN

## 2011-01-03 MED ORDER — MEPERIDINE HCL 25 MG/ML IJ SOLN
INTRAMUSCULAR | Status: AC
  Filled 2011-01-03: qty 1

## 2011-01-03 MED ORDER — SIMETHICONE 80 MG PO CHEW
80.0000 mg | CHEWABLE_TABLET | ORAL | Status: DC | PRN
  Administered 2011-01-04 – 2011-01-05 (×6): 80 mg via ORAL

## 2011-01-03 MED ORDER — OXYTOCIN 20 UNITS IN LACTATED RINGERS INFUSION - SIMPLE
INTRAVENOUS | Status: AC
  Filled 2011-01-03: qty 1000

## 2011-01-03 MED ORDER — PRENATAL PLUS 27-1 MG PO TABS
1.0000 | ORAL_TABLET | Freq: Every day | ORAL | Status: DC
  Administered 2011-01-04 – 2011-01-05 (×2): 1 via ORAL
  Filled 2011-01-03 (×2): qty 1

## 2011-01-03 MED ORDER — MEPERIDINE HCL 25 MG/ML IJ SOLN
INTRAMUSCULAR | Status: DC | PRN
  Administered 2011-01-03 (×2): 12.5 mg via INTRAVENOUS

## 2011-01-03 MED ORDER — IBUPROFEN 600 MG PO TABS
600.0000 mg | ORAL_TABLET | Freq: Four times a day (QID) | ORAL | Status: DC
  Administered 2011-01-03 – 2011-01-06 (×11): 600 mg via ORAL
  Filled 2011-01-03 (×11): qty 1

## 2011-01-03 MED ORDER — KETOROLAC TROMETHAMINE 30 MG/ML IJ SOLN
30.0000 mg | Freq: Four times a day (QID) | INTRAMUSCULAR | Status: AC | PRN
  Administered 2011-01-03: 30 mg via INTRAVENOUS

## 2011-01-03 MED ORDER — LANOLIN HYDROUS EX OINT: 1.0000 "application " | TOPICAL_OINTMENT | CUTANEOUS | Status: DC | PRN

## 2011-01-03 MED ORDER — SODIUM BICARBONATE 8.4 % IV SOLN
INTRAVENOUS | Status: AC
  Filled 2011-01-03: qty 50

## 2011-01-03 MED ORDER — ONDANSETRON HCL 4 MG/2ML IJ SOLN
4.0000 mg | INTRAMUSCULAR | Status: DC | PRN
  Administered 2011-01-03 (×2): 4 mg via INTRAVENOUS
  Filled 2011-01-03: qty 2

## 2011-01-03 MED ORDER — CLINDAMYCIN PHOSPHATE 900 MG/50ML IV SOLN
900.0000 mg | Freq: Once | INTRAVENOUS | Status: AC
  Administered 2011-01-03: 900 mg via INTRAVENOUS
  Filled 2011-01-03: qty 50

## 2011-01-03 MED ORDER — OXYTOCIN 20 UNITS IN LACTATED RINGERS INFUSION - SIMPLE
125.0000 mL/h | INTRAVENOUS | Status: AC
  Administered 2011-01-03: 125 mL/h via INTRAVENOUS

## 2011-01-03 MED ORDER — PHENYLEPHRINE HCL 10 MG/ML IJ SOLN
INTRAMUSCULAR | Status: DC | PRN
  Administered 2011-01-03 (×2): 40 ug via INTRAVENOUS
  Administered 2011-01-03: 80 ug via INTRAVENOUS

## 2011-01-03 MED ORDER — SCOPOLAMINE 1 MG/3DAYS TD PT72
MEDICATED_PATCH | TRANSDERMAL | Status: AC
  Filled 2011-01-03: qty 1

## 2011-01-03 MED ORDER — NALBUPHINE SYRINGE 5 MG/0.5 ML
5.0000 mg | INJECTION | INTRAMUSCULAR | Status: DC | PRN
  Filled 2011-01-03: qty 1

## 2011-01-03 MED ORDER — OXYCODONE-ACETAMINOPHEN 5-325 MG PO TABS
1.0000 | ORAL_TABLET | ORAL | Status: DC | PRN
  Administered 2011-01-03 – 2011-01-04 (×2): 2 via ORAL
  Administered 2011-01-04 (×2): 1 via ORAL
  Administered 2011-01-04: 2 via ORAL
  Administered 2011-01-04 – 2011-01-05 (×3): 1 via ORAL
  Administered 2011-01-05 – 2011-01-06 (×4): 2 via ORAL
  Filled 2011-01-03: qty 1
  Filled 2011-01-03 (×3): qty 2
  Filled 2011-01-03: qty 1
  Filled 2011-01-03 (×3): qty 2
  Filled 2011-01-03: qty 1
  Filled 2011-01-03: qty 2
  Filled 2011-01-03: qty 1
  Filled 2011-01-03: qty 2
  Filled 2011-01-03: qty 1

## 2011-01-03 MED ORDER — DIPHENHYDRAMINE HCL 25 MG PO CAPS
25.0000 mg | ORAL_CAPSULE | ORAL | Status: DC | PRN
  Administered 2011-01-04: 25 mg via ORAL
  Filled 2011-01-03: qty 1

## 2011-01-03 MED ORDER — DEXTROSE 5 % IV SOLN
Freq: Three times a day (TID) | INTRAVENOUS | Status: DC
  Administered 2011-01-03 – 2011-01-04 (×3): via INTRAVENOUS
  Filled 2011-01-03 (×4): qty 4

## 2011-01-03 MED ORDER — TETANUS-DIPHTH-ACELL PERTUSSIS 5-2.5-18.5 LF-MCG/0.5 IM SUSP: 0.5000 mL | Freq: Once | INTRAMUSCULAR | Status: DC

## 2011-01-03 MED ORDER — DIPHENHYDRAMINE HCL 50 MG/ML IJ SOLN: 25.0000 mg | INTRAMUSCULAR | Status: DC | PRN

## 2011-01-03 MED ORDER — CLINDAMYCIN PHOSPHATE 900 MG/50ML IV SOLN: 900.0000 mg | Freq: Three times a day (TID) | INTRAVENOUS | Status: DC

## 2011-01-03 MED ORDER — MORPHINE SULFATE (PF) 0.5 MG/ML IJ SOLN
INTRAMUSCULAR | Status: DC | PRN
  Administered 2011-01-03: 3 mg via EPIDURAL
  Administered 2011-01-03: 2 mg via INTRAVENOUS

## 2011-01-03 MED ORDER — MAGNESIUM HYDROXIDE 400 MG/5ML PO SUSP: 30.0000 mL | ORAL | Status: DC | PRN

## 2011-01-03 MED ORDER — DIPHENHYDRAMINE HCL 25 MG PO CAPS: 25.0000 mg | ORAL_CAPSULE | Freq: Four times a day (QID) | ORAL | Status: DC | PRN

## 2011-01-03 MED ORDER — KETOROLAC TROMETHAMINE 30 MG/ML IJ SOLN
30.0000 mg | Freq: Four times a day (QID) | INTRAMUSCULAR | Status: AC | PRN
  Filled 2011-01-03: qty 1

## 2011-01-03 MED ORDER — CLINDAMYCIN PHOSPHATE 900 MG/50ML IV SOLN
900.0000 mg | Freq: Three times a day (TID) | INTRAVENOUS | Status: DC
  Filled 2011-01-03 (×5): qty 50

## 2011-01-03 MED ORDER — MORPHINE SULFATE 0.5 MG/ML IJ SOLN
INTRAMUSCULAR | Status: AC
  Filled 2011-01-03: qty 10

## 2011-01-03 MED ORDER — SODIUM CHLORIDE 0.9 % IJ SOLN
3.0000 mL | INTRAMUSCULAR | Status: DC | PRN
  Administered 2011-01-04: 3 mL via INTRAVENOUS

## 2011-01-03 MED ORDER — MEASLES, MUMPS & RUBELLA VAC ~~LOC~~ INJ
0.5000 mL | INJECTION | Freq: Once | SUBCUTANEOUS | Status: DC
Start: 2011-01-04 — End: 2011-01-06
  Filled 2011-01-03: qty 0.5

## 2011-01-03 MED ORDER — HYDROMORPHONE HCL 1 MG/ML IJ SOLN: 0.2500 mg | INTRAMUSCULAR | Status: DC | PRN

## 2011-01-03 MED ORDER — DIPHENHYDRAMINE HCL 50 MG/ML IJ SOLN: 12.5000 mg | INTRAMUSCULAR | Status: DC | PRN

## 2011-01-03 MED ORDER — DEXAMETHASONE SODIUM PHOSPHATE 10 MG/ML IJ SOLN
INTRAMUSCULAR | Status: DC | PRN
  Administered 2011-01-03: 10 mg via INTRAVENOUS

## 2011-01-03 MED ORDER — IBUPROFEN 600 MG PO TABS: 600.0000 mg | ORAL_TABLET | Freq: Four times a day (QID) | ORAL | Status: DC | PRN

## 2011-01-03 MED ORDER — DEXAMETHASONE SODIUM PHOSPHATE 10 MG/ML IJ SOLN
INTRAMUSCULAR | Status: AC
  Filled 2011-01-03: qty 1

## 2011-01-03 MED ORDER — SENNOSIDES-DOCUSATE SODIUM 8.6-50 MG PO TABS
2.0000 | ORAL_TABLET | Freq: Every day | ORAL | Status: DC
  Administered 2011-01-03 – 2011-01-05 (×3): 2 via ORAL

## 2011-01-03 MED ORDER — ONDANSETRON HCL 4 MG/2ML IJ SOLN
INTRAMUSCULAR | Status: AC
  Filled 2011-01-03: qty 2

## 2011-01-03 MED ORDER — ONDANSETRON HCL 4 MG/2ML IJ SOLN
4.0000 mg | Freq: Three times a day (TID) | INTRAMUSCULAR | Status: DC | PRN
  Filled 2011-01-03: qty 2

## 2011-01-03 MED ORDER — METOCLOPRAMIDE HCL 5 MG/ML IJ SOLN: 10.0000 mg | Freq: Three times a day (TID) | INTRAMUSCULAR | Status: DC | PRN

## 2011-01-03 MED ORDER — KETOROLAC TROMETHAMINE 60 MG/2ML IM SOLN
INTRAMUSCULAR | Status: AC
  Filled 2011-01-03: qty 2

## 2011-01-03 MED ORDER — LACTATED RINGERS IV SOLN
INTRAVENOUS | Status: DC
  Administered 2011-01-03: 13:00:00 via INTRAVENOUS

## 2011-01-03 MED ORDER — SODIUM CHLORIDE 0.9 % IV SOLN
1.0000 ug/kg/h | INTRAVENOUS | Status: DC | PRN
  Filled 2011-01-03: qty 2.5

## 2011-01-03 MED ORDER — NALOXONE HCL 0.4 MG/ML IJ SOLN: 0.4000 mg | INTRAMUSCULAR | Status: DC | PRN

## 2011-01-03 MED ORDER — PHENYLEPHRINE 40 MCG/ML (10ML) SYRINGE FOR IV PUSH (FOR BLOOD PRESSURE SUPPORT)
PREFILLED_SYRINGE | INTRAVENOUS | Status: AC
  Filled 2011-01-03: qty 10

## 2011-01-03 MED ORDER — ZOLPIDEM TARTRATE 5 MG PO TABS: 5.0000 mg | ORAL_TABLET | Freq: Every evening | ORAL | Status: DC | PRN

## 2011-01-03 MED ORDER — LIDOCAINE-EPINEPHRINE (PF) 2 %-1:200000 IJ SOLN
INTRAMUSCULAR | Status: AC
  Filled 2011-01-03: qty 20

## 2011-01-03 MED ORDER — OXYTOCIN 20 UNITS IN LACTATED RINGERS INFUSION - SIMPLE
INTRAVENOUS | Status: DC | PRN
  Administered 2011-01-03: 40 [IU] via INTRAVENOUS

## 2011-01-03 MED ORDER — DIBUCAINE 1 % RE OINT: 1.0000 "application " | TOPICAL_OINTMENT | RECTAL | Status: DC | PRN

## 2011-01-03 MED ORDER — GENTAMICIN SULFATE 40 MG/ML IJ SOLN
180.0000 mg | Freq: Once | INTRAVENOUS | Status: AC
  Administered 2011-01-03: 180 mg via INTRAVENOUS
  Filled 2011-01-03: qty 4.5

## 2011-01-03 MED ORDER — FERROUS SULFATE 325 (65 FE) MG PO TABS
325.0000 mg | ORAL_TABLET | Freq: Two times a day (BID) | ORAL | Status: DC
  Administered 2011-01-03 – 2011-01-06 (×5): 325 mg via ORAL
  Filled 2011-01-03 (×5): qty 1

## 2011-01-03 MED ORDER — KETOROLAC TROMETHAMINE 60 MG/2ML IM SOLN
60.0000 mg | Freq: Once | INTRAMUSCULAR | Status: AC | PRN
  Administered 2011-01-03: 60 mg via INTRAMUSCULAR

## 2011-01-03 MED ORDER — OXYTOCIN 10 UNIT/ML IJ SOLN
INTRAMUSCULAR | Status: AC
  Filled 2011-01-03: qty 4

## 2011-01-03 SURGICAL SUPPLY — 40 items
CANISTER WOUND CARE 500ML ATS (WOUND CARE) IMPLANT
CHLORAPREP W/TINT 26ML (MISCELLANEOUS) ×2 IMPLANT
CLOTH BEACON ORANGE TIMEOUT ST (SAFETY) ×2 IMPLANT
CONTAINER PREFILL 10% NBF 15ML (MISCELLANEOUS) IMPLANT
DERMABOND ADVANCED (GAUZE/BANDAGES/DRESSINGS) ×2 IMPLANT
DRESSING TELFA 8X3 (GAUZE/BANDAGES/DRESSINGS) ×2 IMPLANT
DRSG COVADERM 4X8 (GAUZE/BANDAGES/DRESSINGS) ×2 IMPLANT
DRSG VAC ATS LRG SENSATRAC (GAUZE/BANDAGES/DRESSINGS) IMPLANT
DRSG VAC ATS MED SENSATRAC (GAUZE/BANDAGES/DRESSINGS) IMPLANT
DRSG VAC ATS SM SENSATRAC (GAUZE/BANDAGES/DRESSINGS) IMPLANT
ELECT REM PT RETURN 9FT ADLT (ELECTROSURGICAL) ×2
ELECTRODE REM PT RTRN 9FT ADLT (ELECTROSURGICAL) ×1 IMPLANT
EXTRACTOR VACUUM M CUP 4 TUBE (SUCTIONS) IMPLANT
GAUZE SPONGE 4X4 12PLY STRL LF (GAUZE/BANDAGES/DRESSINGS) ×4 IMPLANT
GLOVE BIO SURGEON STRL SZ 6.5 (GLOVE) ×4 IMPLANT
GOWN PREVENTION PLUS LG XLONG (DISPOSABLE) ×6 IMPLANT
KIT ABG SYR 3ML LUER SLIP (SYRINGE) IMPLANT
NEEDLE HYPO 25X5/8 SAFETYGLIDE (NEEDLE) ×2 IMPLANT
NS IRRIG 1000ML POUR BTL (IV SOLUTION) ×2 IMPLANT
PACK C SECTION WH (CUSTOM PROCEDURE TRAY) ×2 IMPLANT
PAD ABD 7.5X8 STRL (GAUZE/BANDAGES/DRESSINGS) ×2 IMPLANT
RTRCTR C-SECT PINK 25CM LRG (MISCELLANEOUS) ×2 IMPLANT
RTRCTR C-SECT PINK 34CM XLRG (MISCELLANEOUS) IMPLANT
SLEEVE SCD COMPRESS KNEE MED (MISCELLANEOUS) ×2 IMPLANT
STAPLER VISISTAT 35W (STAPLE) IMPLANT
SUT MNCRL 0 VIOLET CTX 36 (SUTURE) ×4 IMPLANT
SUT MNCRL AB 3-0 PS2 27 (SUTURE) ×2 IMPLANT
SUT MONOCRYL 0 CTX 36 (SUTURE) ×4
SUT PDS AB 0 CTX 36 PDP370T (SUTURE) ×2 IMPLANT
SUT PLAIN 0 NONE (SUTURE) IMPLANT
SUT VIC AB 0 CT1 27 (SUTURE) ×2
SUT VIC AB 0 CT1 27XBRD ANBCTR (SUTURE) ×2 IMPLANT
SUT VIC AB 2-0 CT1 (SUTURE) IMPLANT
SUT VIC AB 2-0 CT1 27 (SUTURE) ×1
SUT VIC AB 2-0 CT1 TAPERPNT 27 (SUTURE) ×1 IMPLANT
SUT VIC AB 2-0 SH 27 (SUTURE)
SUT VIC AB 2-0 SH 27XBRD (SUTURE) IMPLANT
TOWEL OR 17X24 6PK STRL BLUE (TOWEL DISPOSABLE) ×4 IMPLANT
TRAY FOLEY CATH 14FR (SET/KITS/TRAYS/PACK) IMPLANT
WATER STERILE IRR 1000ML POUR (IV SOLUTION) ×2 IMPLANT

## 2011-01-03 NOTE — Progress Notes (Signed)
UR chart review completed.  

## 2011-01-03 NOTE — Progress Notes (Signed)
Encounter addended by: Isabella Bowens on: 01/03/2011  9:23 AM<BR>     Documentation filed: Notes Section

## 2011-01-03 NOTE — Progress Notes (Signed)
Renee [name] is a 35 y.o. G3P1011 at [redacted]w[redacted]d by LMP admitted for induction of labor due to Post dates. Due date 9/12.  Subjective: Comfortable  Objective: BP 109/54  Pulse 92  Temp(Src) 100.7 F (38.2 C) (Oral)  Resp 18  Ht 5\' 3"  (1.6 m)  Wt 89.812 kg (198 lb)  BMI 35.07 kg/m2  SpO2 97%  LMP 03/21/2010      FHT:  FHR: 150 bpm, variability: moderate,  accelerations:  Present,  decelerations:  Absent UC:   regular, every 2 minutes SVE:   Dilation: Lip/rim Effacement (%): 90 Station: -1 Exam by:: jackson-moore  Labs: Lab Results  Component Value Date   WBC 14.0* 01/02/2011   HGB 13.4 01/02/2011   HCT 39.1 01/02/2011   MCV 94.4 01/02/2011   PLT 133* 01/02/2011    Assessment / Plan: Arrest in active phase of labor  Labor: See above Preeclampsia:  N/A Fetal Wellbeing:  Category I Pain Control:  Epidural I/D:  Early chorio amnionitis; Gent/Clind pre-op Anticipated MOD:  C/D; informed consent obtained  JACKSON-MOORE,Shubham Thackston A 01/03/2011, 3:43 AM

## 2011-01-03 NOTE — Anesthesia Postprocedure Evaluation (Signed)
  Anesthesia Post-op Note  Patient: Renee [name]  Procedure(s) Performed:  CESAREAN SECTION  Patient Location: PACU and Mother/Baby  Anesthesia Type: Epidural  Level of Consciousness: awake, alert  and oriented  Airway and Oxygen Therapy: Patient Spontanous Breathing  Post-op Pain: none  Post-op Assessment: Post-op Vital signs reviewed  Post-op Vital Signs: Reviewed and stable  Complications: No apparent anesthesia complications

## 2011-01-03 NOTE — Consult Note (Signed)
ANTIBIOTIC CONSULT NOTE - INITIAL  Pharmacy Consult for Gentamicin Indication:  Maternal fever R/O chorioamniotitis  No Known Allergies  Patient Measurements: Height: 5\' 3"  (160 cm) (5'3") Weight: 198 lb (89.812 kg) IBW/kg (Calculated) : 52.4  Adjusted Body Weight: 63.7 kg  Vital Signs: Temp: 102.2 F (39 C) (09/20 0322) Temp src: Axillary (09/20 0322) BP: 121/63 mmHg (09/20 0331) Pulse Rate: 87  (09/20 0331) Intake/Output from previous day: 09/19 0701 - 09/20 0700 In: 2000 [I.V.:2000] Out: -  Intake/Output from this shift: Total I/O In: 2000 [I.V.:2000] Out: -   Labs:  Basename 01/02/11 0820  WBC 14.0*  HGB 13.4  PLT 133*  LABCREA --  CREATININE --  CRCLEARANCE --   No results found for this basename: VANCOTROUGH:2,VANCOPEAK:2,VANCORANDOM:2,GENTTROUGH:2,GENTPEAK:2,GENTRANDOM:2,TOBRATROUGH:2,TOBRAPEAK:2,TOBRARND:2,AMIKACINPEAK:2,AMIKACINTROU:2,AMIKACIN:2, in the last 72 hours   Microbiology: Recent Results (from the past 720 hour(s))  STREP B DNA PROBE     Status: Normal      Component Value Range Status Comment   Group B Strep Ag Negative        Medical History: History reviewed. No pertinent past medical history.  Medications:  Clindamycin 900mg  IV x 1  Assessment: Coverage for infection of pelvic source.  Patient with normal urine output for age and pregnancy, therefore estimating CrCl = 131ml/min  Goal of Therapy:  Desire peak gentamicin serum level 6-65mcg/ml  And trough level <102mcg/ml  Plan:  (1) Gentamicin loading dose = 180mg  x 1 (2) Maintenance regimen (if continued post C-sect) = 160mg  q8h (3) Will order serum Creatinine if continued (4) Will order actual serum gentamicin levels if continued >48hr or as clinically indicated   Keaton Stirewalt, Lloyd Huger E 01/03/2011,4:28 AM

## 2011-01-03 NOTE — Anesthesia Postprocedure Evaluation (Signed)
  Anesthesia Post-op Note  Patient: Renee [name]  Procedure(s) Performed:  CESAREAN SECTION   Patient is awake, responsive, moving her legs, and has signs of resolution of her numbness. Pain and nausea are reasonably well controlled. Vital signs are stable and clinically acceptable. Oxygen saturation is clinically acceptable. There are no apparent anesthetic complications at this time. Patient is ready for discharge.

## 2011-01-03 NOTE — Op Note (Signed)
Cesarean Section Procedure Note   Renee [name]   01/03/2011  Indications: Dystocia   Pre-operative Diagnosis: Arrest of dilatation;Intrauterine Pregnancy At Term; Laboring. Suspected LGA fetus, chorioamnionitis  Post-operative Diagnosis: Same   Surgeon: Antionette Char A  Assistants: none  Anesthesia: epidural  Procedure Details:  The patient was seen in the Holding Room. The risks, benefits, complications, treatment options, and expected outcomes were discussed with the patient. The patient concurred with the proposed plan, giving informed consent. The patient was identified as Renee [name] and the procedure verified as C-Section Delivery. A Time Out was held and the above information confirmed.  After induction of anesthesia, the patient was draped and prepped in the usual sterile manner. A transverse incision was made and carried down through the subcutaneous tissue to the fascia. The fascial incision was made and extended transversely. The fascia was separated from the underlying rectus tissue superiorly and inferiorly. The peritoneum was identified and entered. The peritoneal incision was extended longitudinally.  A low transverse uterine incision was made. Delivered from cephalic presentation was a 9 lb 12 oz living newborn female infant with Apgar scores of 9 at one minute and 9 at five minutes. A cord ph was not sent. The umbilical cord was clamped and cut cord. A sample was obtained for evaluation. The placenta was removed Intact and appeared normal.  The uterine incision was closed with running locked sutures of 1-0 Monocryl. A second imbricating layer of the same suture was placed.  Hemostasis was observed. The paracolic gutters were irrigated. The fascia was then reapproximated with running sutures of 1-0Vicryl. The subcuticular closure was performed using 3-0 Monocryl.  Instrument, sponge, and needle counts were correct prior the abdominal closure and were correct at the conclusion of  the case.    Findings:  See above   Estimated Blood Loss: * No blood loss amount entered *   Total IV Fluids:   Urine Output: per Anesthesiology  Specimens:  Specimens    None       Complications: no complications  Disposition: PACU - hemodynamically stable.  Maternal Condition: stable   Baby condition / location:  nursery-stable    Signed: Surgeon(s): Roseanna Rainbow, MD

## 2011-01-03 NOTE — Progress Notes (Addendum)
Dr. Tamela Oddi notified of fhr tracing, uc pattern, maternal vs and sve.

## 2011-01-03 NOTE — Transfer of Care (Signed)
Immediate Anesthesia Transfer of Care Note  Patient: Renee [name]  Procedure(s) Performed:  CESAREAN SECTION  Patient Location: PACU  Anesthesia Type: Epidural  Level of Consciousness: awake, alert  and oriented  Airway & Oxygen Therapy: Patient Spontanous Breathing  Post-op Assessment: Report given to PACU RN and Post -op Vital signs reviewed and stable  Post vital signs: stable  Complications: No apparent anesthesia complications

## 2011-01-03 NOTE — Progress Notes (Signed)
Tamela Oddi at bedside to discuss c-section with patient - questions answered, risks and benefits discussed, pt verbalized understanding, to proceed with c-section at this time - C Hayes,RN 01/03/11 0354

## 2011-01-04 LAB — INFLUENZA A+B VIRUS AG-DIRECT(RAPID): Inflenza A Ag: NEGATIVE

## 2011-01-04 LAB — HEMOGLOBIN: Hemoglobin: 8.7 g/dL — ABNORMAL LOW (ref 12.0–15.0)

## 2011-01-04 LAB — URINALYSIS, ROUTINE W REFLEX MICROSCOPIC
Ketones, ur: 15 — AB
Nitrite: NEGATIVE
Protein, ur: NEGATIVE

## 2011-01-04 LAB — RAPID STREP SCREEN (MED CTR MEBANE ONLY): Streptococcus, Group A Screen (Direct): NEGATIVE

## 2011-01-04 LAB — COMPREHENSIVE METABOLIC PANEL
ALT: 12
Albumin: 3.1 — ABNORMAL LOW
Alkaline Phosphatase: 53
Potassium: 3.4 — ABNORMAL LOW
Sodium: 131 — ABNORMAL LOW
Total Protein: 6.2

## 2011-01-04 LAB — CBC
Platelets: 175
RDW: 12.3

## 2011-01-04 MED ORDER — GENTAMICIN SULFATE 40 MG/ML IJ SOLN: 160.0000 mg | Freq: Two times a day (BID) | INTRAVENOUS | Status: DC

## 2011-01-04 MED ORDER — CLINDAMYCIN PHOSPHATE 900 MG/50ML IV SOLN
900.0000 mg | Freq: Three times a day (TID) | INTRAVENOUS | Status: DC
Start: 2011-01-04 — End: 2011-01-04
  Filled 2011-01-04: qty 50

## 2011-01-04 NOTE — Progress Notes (Signed)
  Subjective: POD# 1 s/p Cesarean Delivery.  Indications: failure to progress  RH status/Rubella reviewed. Feeding: breast Patient reports tolerating PO.  Denies HA/SOB/C/P/N/V/dizziness.  Reports flatus or BM. Breast symptoms: None.  She reports vaginal bleeding as normal, without clots.  She is ambulating, urinating without difficulty.   C/O incisional pain.  Objective: Vital signs in last 24 hours: BP 99/65  Pulse 89  Temp(Src) 98 F (36.7 C) (Oral)  Resp 18  Ht 5\' 3"  (1.6 m)  Wt 89.812 kg (198 lb)  BMI 35.07 kg/m2  SpO2 98%  LMP 03/21/2010   Total I/O In: -  Out: 500 [Urine:500]   Physical Exam:  General: alert CV: Regular rate and rhythm Resp: clear Abdomen: soft, nontender, normal bowel sounds Lochia: minimal Uterine Fundus: firm, below umbilicus, nontender Incision: clean, dry and intact Ext: extremities normal, atraumatic, no cyanosis or edema    Basename 01/04/11 0530 01/02/11 0820  HGB 8.7* 13.4  HCT -- 39.1      Assessment/Plan: 35 y.o.  status post Cesarean section. POD# 1.   Doing well, stable.     Anemia secondary to blood loss Afebrile/no signs, sxs of endometritis D/C abx          Advance diet as tolerated Start po pain meds K pad  HLIV  Ambulate IS Routine post-op care  JACKSON-MOORE,Kennisha Qin A 01/04/2011, 9:39 AM

## 2011-01-04 NOTE — Consult Note (Signed)
ANTIBIOTIC CONSULT NOTE - FOLLOW UP  Pharmacy Consult for gentamicin Indication:  R/O possible endometritis s/p C-sect with maternal fever ( probable chorioamniotitis)  No Known Allergies  Patient Measurements: Height: 5\' 3"  (160 cm) (5'3") Weight: 198 lb (89.812 kg) IBW/kg (Calculated) : 52.4  Adjusted Body Weight: 63.7 kg  Vital Signs: Temp: 98 F (36.7 C) (09/21 0518) Temp src: Oral (09/21 0518) BP: 99/65 mmHg (09/21 0518) Pulse Rate: 89  (09/21 0518) Intake/Output from previous day: 09/20 0701 - 09/21 0700 In: 1770 [P.O.:1020; I.V.:750] Out: 3150 [Urine:2750; Emesis/NG output:400] Intake/Output from this shift:    Labs:  Basename 01/04/11 0530 01/02/11 0820  WBC -- 14.0*  HGB 8.7* 13.4  PLT -- 133*  LABCREA -- --  CREATININE 0.87 --  CRCLEARANCE -- --     Microbiology: Recent Results (from the past 720 hour(s))  STREP B DNA PROBE     Status: Normal      Component Value Range Status Comment   Group B Strep Ag Negative        Anti-infectives     Start     Dose/Rate Route Frequency Ordered Stop   01/03/11 0715   clindamycin (CLEOCIN) IVPB 900 mg  Status:  Discontinued        900 mg 100 mL/hr over 30 Minutes Intravenous 3 times per day 01/03/11 0708 01/03/11 0728   01/03/11 0600   clindamycin (CLEOCIN) IVPB 900 mg  Status:  Discontinued        900 mg 100 mL/hr over 30 Minutes Intravenous 3 times per day 01/03/11 0407 01/03/11 0422   01/03/11 0445   gentamicin (GARAMYCIN) 180 mg in dextrose 5 % 50 mL IVPB        180 mg 109 mL/hr over 30 Minutes Intravenous  Once 01/03/11 0436 01/03/11 0440   01/03/11 0430   clindamycin (CLEOCIN) IVPB 900 mg        900 mg 100 mL/hr over 30 Minutes Intravenous  Once 01/03/11 0423 01/03/11 0425   01/02/11 1300   penicillin G potassium 2.5 Million Units in dextrose 5 % 100 mL IVPB  Status:  Discontinued        2.5 Million Units 200 mL/hr over 30 Minutes Intravenous Every 4 hours 01/02/11 0848 01/02/11 0906   01/02/11 0900    penicillin G potassium 5 Million Units in dextrose 5 % 250 mL IVPB  Status:  Discontinued        5 Million Units 250 mL/hr over 60 Minutes Intravenous  Once 01/02/11 0848 01/02/11 0906   01/02/11 0900   clindamycin (CLEOCIN) IVPB 900 mg  Status:  Discontinued        900 mg 100 mL/hr over 30 Minutes Intravenous  Once 01/02/11 0848 01/02/11 0906          Assessment:     Patient afebrile x 24hr since 0600 9/20th.  Serum creatinine slightly higher than expected with an estimated CrCl 80 ml/min.  Goal of Therapy:     Desire gentamicin serum peak level 6-60mcg/ml & trough level <29mcg/ml.  Present regimen of 160mg  q8h yield calculated levels of  7.5 / 1.2 mcg/ml.  Therefore, need to lengthen the interval to decrease risk of accumulation and risk of nephrotoxicity.  Plan:  (1) Plan to change regimen to 160mg  q12h for calculated gentamicin levels of 6.7 / 0.5 mcg/ml. (2) Will have MD address need for continued gentamicin therapy since afebrile x 24hr. (3)  If therapy continued >24-36hr, will measure actual serum gentamicin levels Sunday  morning or as clinically indicated.   Scarlett Presto 01/04/2011,7:06 AM

## 2011-01-05 NOTE — Anesthesia Postprocedure Evaluation (Signed)
  Anesthesia Post-op Note  Patient: Renee [name]  Procedure(s) Performed:  CESAREAN SECTION  Patient Location: PACU and Mother/Baby  Anesthesia Type: Epidural  Level of Consciousness: awake, alert  and oriented  Airway and Oxygen Therapy: Patient Spontanous Breathing   Post-op Assessment: Patient's Cardiovascular Status Stable and Respiratory Function Stable  Post-op Vital Signs: stable  Complications: No apparent anesthesia complications

## 2011-01-05 NOTE — Progress Notes (Signed)
  Subjective: POD# 2 s/p Cesarean Delivery.  Indications: failure to progress  RH status/Rubella reviewed. Feeding: breast Patient reports tolerating PO.  Denies HA/SOB/C/P/N/V/dizziness.  Reports flatus or BM. Breast symptoms: None.  She reports vaginal bleeding as normal, without clots.  She is ambulating, urinating without difficulty.     Objective: Vital signs in last 24 hours: BP 109/74  Pulse 81  Temp(Src) 97.7 F (36.5 C) (Oral)  Resp 18  Ht 5\' 3"  (1.6 m)  Wt 89.812 kg (198 lb)  BMI 35.07 kg/m2  SpO2 98%  LMP 03/21/2010       Physical Exam:  General: alert CV: Regular rate and rhythm Resp: clear Abdomen: soft, nontender, normal bowel sounds Lochia: minimal Uterine Fundus: firm, below umbilicus, nontender Incision: clean, dry and intact Ext: edema 3+    Basename 01/04/11 0530  HGB 8.7*  HCT --      Assessment/Plan: 35 y.o.  status post Cesarean section. POD# 2.   Doing well, stable.               Ambulate IS Routine post-op care  JACKSON-MOORE,Donielle Kaigler A 01/05/2011, 10:15 AM

## 2011-01-05 NOTE — Progress Notes (Signed)
Encounter addended by: Len Blalock on: 01/05/2011  8:52 AM<BR>     Documentation filed: Notes Section

## 2011-01-06 ENCOUNTER — Encounter (HOSPITAL_COMMUNITY): Payer: Self-pay | Admitting: Obstetrics & Gynecology

## 2011-01-06 DIAGNOSIS — D62 Acute posthemorrhagic anemia: Secondary | ICD-10-CM | POA: Diagnosis not present

## 2011-01-06 MED ORDER — IBUPROFEN 600 MG PO TABS: 600.0000 mg | ORAL_TABLET | Freq: Four times a day (QID) | ORAL | Status: AC

## 2011-01-06 MED ORDER — OXYCODONE-ACETAMINOPHEN 5-325 MG PO TABS: 1.0000 | ORAL_TABLET | ORAL | Status: AC | PRN

## 2011-01-06 MED ORDER — FERROUS SULFATE 325 (65 FE) MG PO TABS: 325.0000 mg | ORAL_TABLET | Freq: Two times a day (BID) | ORAL | Status: DC

## 2011-01-06 NOTE — Discharge Summary (Signed)
Obstetric Discharge Summary Reason for Admission: induction of labor Prenatal Procedures: none Intrapartum Procedures: cesarean: low cervical, transverse Postpartum Procedures: antibiotics Complications-Operative and Postpartum: none Hemoglobin  Date Value Range Status  01/04/2011 8.7* 12.0-15.0 (g/dL) Final     DELTA CHECK NOTED     REPEATED TO VERIFY     HCT  Date Value Range Status  01/02/2011 39.1  36.0-46.0 (%) Final    Discharge Diagnoses: Term Pregnancy-delivered and Amnionitis  Discharge Information: Date: 01/06/2011 Activity: pelvic rest Diet: routine Medications: Ibuprophen, Iron and Percocet Condition: stable Instructions: See above Discharge to: home Follow-up Information    Follow up with Antionette Char A, MD. Call in 2 weeks.   Contact information:   [address], [address] [place] [address] [phone]          Newborn Data: Live born female  Birth Weight: 9 lb 12.8 oz (4445 g) APGAR: 9, 9  Home with mother.  JACKSON-MOORE,Iven Earnhart A 01/06/2011, 10:55 AM

## 2011-01-06 NOTE — Progress Notes (Signed)
Subjective: POD #3 s/p LTC/S  Indication:failure to progress Patient reports tolerating PO.  Denies HA/SOB/C/P/N/V/dizziness.  Reports flatus or BM. Breast symptoms: None.  She reports vaginal bleeding as normal, without clots.  She is ambulating, urinating without difficulty.     Objective: Vital signs in last 24 hours: BP 118/66  Pulse 91  Temp(Src) 98.1 F (36.7 C) (Oral)  Resp 18  Ht 5\' 3"  (1.6 m)  Wt 89.812 kg (198 lb)  BMI 35.07 kg/m2  SpO2 98%  LMP 03/21/2010  Physical Exam:  General: alert CV: Regular rate and rhythm Resp: clear Abdomen: soft, nontender, normal bowel sounds Lochia: minimal Uterine Fundus: firm, below umbilicus, nontender Incision: clean, dry and intact Ext: extremities normal, atraumatic, no cyanosis or edema  Basename 01/04/11 0530  HGB 8.7*  HCT --    Assessment/Plan: 35 y.o. status post Cesarean section POD# 3.  normal post-operative exam  Routine post-op care D/C home  JACKSON-MOORE,Makaria Poarch A 01/06/2011, 10:45 AM

## 2011-01-11 LAB — CBC
HCT: 42.6
MCHC: 34.3
Platelets: 168
RDW: 12.4

## 2011-01-16 LAB — CBC
HCT: 38.9
Hemoglobin: 13.5
MCHC: 34.3
MCHC: 34.4
MCV: 96.5
Platelets: 227
Platelets: 224
RBC: 4.13
RBC: 3.68 — ABNORMAL LOW
RBC: 4.03
RDW: 11.7
WBC: 9.1
WBC: 10.5

## 2011-01-16 LAB — COMPREHENSIVE METABOLIC PANEL
ALT: 58 — ABNORMAL HIGH
AST: 32
AST: 52 — ABNORMAL HIGH
Albumin: 3.5
Albumin: 3.5
Alkaline Phosphatase: 76
Alkaline Phosphatase: 90
BUN: 14
BUN: 13
CO2: 27
Calcium: 8.9
Chloride: 105
Chloride: 107
Creatinine, Ser: 0.85
Creatinine, Ser: 0.8
GFR calc Af Amer: >60
GFR calc Af Amer: >60
GFR calc non Af Amer: >60
Glucose, Bld: 103 — ABNORMAL HIGH
Potassium: 3.6
Potassium: 4.6
Sodium: 141
Total Bilirubin: 0.9
Total Bilirubin: 0.9
Total Protein: 6.2
Total Protein: 5.3 — ABNORMAL LOW

## 2011-01-16 LAB — DIFFERENTIAL
Basophils Absolute: 0
Basophils Relative: 0
Lymphocytes Relative: 23
Monocytes Absolute: 0.5
Neutro Abs: 7.4

## 2011-01-16 LAB — URINALYSIS, ROUTINE W REFLEX MICROSCOPIC
Glucose, UA: NEGATIVE
Hgb urine dipstick: NEGATIVE
Protein, ur: NEGATIVE
Specific Gravity, Urine: 1.025
pH: 6

## 2011-01-16 LAB — AMYLASE: Amylase: 65

## 2011-01-16 LAB — LIPASE, BLOOD: Lipase: 71 — ABNORMAL HIGH

## 2011-01-21 ENCOUNTER — Encounter (HOSPITAL_COMMUNITY): Payer: Self-pay

## 2011-07-23 ENCOUNTER — Encounter (HOSPITAL_COMMUNITY): Payer: Self-pay | Admitting: Emergency Medicine

## 2011-07-23 ENCOUNTER — Emergency Department (INDEPENDENT_AMBULATORY_CARE_PROVIDER_SITE_OTHER)
Admission: EM | Admit: 2011-07-23 | Discharge: 2011-07-23 | Disposition: A | Discharge status: Home / Self Care | Payer: Self-pay | Source: Home / Self Care | Attending: Family Medicine | Admitting: Family Medicine

## 2011-07-23 DIAGNOSIS — J309 Allergic rhinitis, unspecified: Secondary | ICD-10-CM

## 2011-07-23 MED ORDER — FLUTICASONE PROPIONATE 50 MCG/ACT NA SUSP: 2.0000 | Freq: Every day | NASAL | Status: DC

## 2011-07-23 MED ORDER — NEOMYCIN-POLYMYXIN-HC 3.5-10000-1 OT SOLN: 3.0000 [drp] | Freq: Three times a day (TID) | OTIC | Status: AC

## 2011-07-23 MED ORDER — GUAIFENESIN-CODEINE 100-10 MG/5ML PO SYRP: 5.0000 mL | ORAL_SOLUTION | Freq: Four times a day (QID) | ORAL | Status: AC | PRN

## 2011-07-23 NOTE — Discharge Instructions (Signed)
Use steroid nasal spray as directed. May also continue to use an over the counter antihistamine such as loratadine (Claritin), cetirizine (Zyrtec), or fexofenadine (Allegra). Use the cough syrup as needed and as directed. Return to care should your symptoms not improve, or worsen in any way. If your RIGHT ear should become more painful, or express drainage, then fill ear drops and use as directed.

## 2011-07-23 NOTE — ED Provider Notes (Signed)
History     CSN: [id number]  Arrival date & time 07/23/11  0907   First MD Initiated Contact with Patient 07/23/11 1001      Chief Complaint  Patient presents with  . Cough    (Consider location/radiation/quality/duration/timing/severity/associated sxs/prior treatment) HPI Comments: Renee [name] presents for evaluation of persistent nasal congestion, sore throat, non-productive cough. She denies any fever, no hx of allergies. Though she wonders if this could be allergies as she had similar sx last year; she has been taking Claritin-D with some mild improvement in sx.   Patient is a 36 y.o. female presenting with cough. The history is provided by the patient.  Cough This is a new problem. The current episode started more than 2 days ago. The problem occurs constantly. The cough is non-productive. There has been no fever. Associated symptoms include rhinorrhea. Pertinent negatives include no sore throat. She has tried decongestants for the symptoms. The treatment provided mild relief.    Past Medical History  Diagnosis Date  . Breast feeding status of mother     currently breast feeding    Past Surgical History  Procedure Date  . Cholecystectomy 01/2008  . Cesarean section 01/03/2011    Procedure: CESAREAN SECTION;  Surgeon: Roseanna Rainbow, MD;  Location: [facility];  Service: Gynecology;  Laterality: N/A;    No family history on file.  History  Substance Use Topics  . Smoking status: Never Smoker   . Smokeless tobacco: Never Used  . Alcohol Use: No    OB History    Grav Para Term Preterm Abortions TAB SAB Ect Mult Living   3 2 2  1  1   2       Review of Systems  Constitutional: Negative.   HENT: Positive for congestion, rhinorrhea and sinus pressure. Negative for sore throat.   Eyes: Negative.   Respiratory: Positive for cough.   Cardiovascular: Negative.   Gastrointestinal: Negative.   Genitourinary: Negative.   Musculoskeletal: Negative.   Skin: Negative.     Neurological: Negative.     Allergies  Review of patient's allergies indicates no known allergies.  Home Medications   Current Outpatient Rx  Name Route Sig Dispense Refill  . CLARITIN-D 24 HOUR PO Oral Take by mouth.    . TUMS PO Oral Take 1 tablet by mouth daily.      Marland Kitchen FERROUS SULFATE 325 (65 FE) MG PO TABS Oral Take 1 tablet (325 mg total) by mouth 2 (two) times daily with a meal. 60 tablet 5  . FLUTICASONE PROPIONATE 50 MCG/ACT NA SUSP Nasal Place 2 sprays into the nose daily. 16 g 2  . GUAIFENESIN-CODEINE 100-10 MG/5ML PO SYRP Oral Take 5 mLs by mouth every 6 (six) hours as needed for cough or congestion. 120 mL 0  . NEOMYCIN-POLYMYXIN-HC 3.5-10000-1 OT SOLN Right Ear Place 3 drops into the right ear 3 (three) times daily. 10 mL 0  . PRENATAL PLUS 27-1 MG PO TABS Oral Take 1 tablet by mouth daily.        BP 106/67  Pulse 90  Temp(Src) 98.4 F (36.9 C) (Oral)  Resp 16  SpO2 98%  LMP 07/23/2011  Breastfeeding? Yes  Physical Exam  Nursing note and vitals reviewed. Constitutional: She is oriented to person, place, and time. She appears well-developed and well-nourished.  HENT:  Head: Normocephalic and atraumatic.  Right Ear: There is drainage. Tympanic membrane is retracted.  Left Ear: Tympanic membrane is retracted.  Mouth/Throat: Uvula is midline, oropharynx is  clear and moist and mucous membranes are normal.  Eyes: EOM are normal.  Neck: Normal range of motion.  Cardiovascular: Normal rate, regular rhythm, S1 normal, S2 normal and normal heart sounds.   No murmur heard. Pulmonary/Chest: Effort normal and breath sounds normal. She has no decreased breath sounds. She has no wheezes. She has no rhonchi.  Musculoskeletal: Normal range of motion.  Neurological: She is alert and oriented to person, place, and time.  Skin: Skin is warm and dry.  Psychiatric: Her behavior is normal.    ED Course  Procedures (including critical care time)  Labs Reviewed - No data to  display No results found.   1. Allergic rhinitis       MDM  Given rx for fluticasone, guaifenesin AC, and delayed rx for cortisporin ear drops if ear pain or drainage worsens       Renaee Munda, MD 07/23/11 1538

## 2011-07-23 NOTE — ED Notes (Signed)
Cough and stuffy nose, sore throat, non-productive cough.  Denies fever, general malaise.

## 2013-03-27 ENCOUNTER — Encounter (HOSPITAL_COMMUNITY): Payer: Self-pay | Admitting: Emergency Medicine

## 2013-03-27 ENCOUNTER — Emergency Department (HOSPITAL_COMMUNITY)
Admission: EM | Admit: 2013-03-27 | Discharge: 2013-03-27 | Disposition: A | Discharge status: Home / Self Care | Payer: No Typology Code available for payment source | Source: Home / Self Care | Attending: Family Medicine | Admitting: Family Medicine

## 2013-03-27 ENCOUNTER — Ambulatory Visit: Payer: Medicaid Other

## 2013-03-27 DIAGNOSIS — K0889 Other specified disorders of teeth and supporting structures: Secondary | ICD-10-CM

## 2013-03-27 DIAGNOSIS — K089 Disorder of teeth and supporting structures, unspecified: Secondary | ICD-10-CM

## 2013-03-27 MED ORDER — HYDROCODONE-ACETAMINOPHEN 5-325 MG PO TABS: 1.0000 | ORAL_TABLET | ORAL | Status: DC | PRN

## 2013-03-27 MED ORDER — PENICILLIN V POTASSIUM 250 MG PO TABS: ORAL_TABLET | ORAL | Status: DC

## 2013-03-27 NOTE — ED Provider Notes (Signed)
Medical screening examination/treatment/procedure(s) were performed by resident physician or non-physician practitioner and as supervising physician I was immediately available for consultation/collaboration.   KINDL,JAMES DOUGLAS MD.   James D Kindl, MD 03/27/13 1639 

## 2013-03-27 NOTE — ED Provider Notes (Signed)
CSN: [id number]     Arrival date & time 03/27/13  0906 History   First MD Initiated Contact with Patient 03/27/13 0940     Chief Complaint  Patient presents with  . Dental Pain    Patient is a 37 y.o. female presenting with tooth pain. The history is provided by the patient.  Dental Pain Location:  Lower Lower teeth location:  22/LL cuspid and 27/RL cuspid Quality:  Aching and throbbing Severity:  Moderate Onset quality:  Gradual Duration:  1 week Timing:  Intermittent Progression:  Worsening Chronicity:  Chronic Previous work-up:  Dental exam Relieved by:  Acetaminophen and NSAIDs Associated symptoms: oral bleeding   Associated symptoms: no congestion, no difficulty swallowing, no drooling, no facial pain, no facial swelling, no fever, no gum swelling, no neck pain, no neck swelling, no oral lesions and no trismus   Risk factors: lack of dental care and periodontal disease   Pt reports recent dental exam at [facility] where she had her teeth cleaned and some teeth treated w/ topical antibiotics. She has had persistent dental pain for several weeks which has worsened over the last week. The pain is generalized to entire gums but more acutely at the base of both lower cuspids. Tylenol and Ibuprofen do help some but less recently. She does have intermittent bleeding of her gums and was told at the dental clinic she had several teeth that needed further evaluation as well as some significant bone loss. Pt is here for her "further evaluation" because she was seen here last year and had no where else to go. Pt reports she has no insurance or PCP but does have her "orange card" and is aware that she is supposed to be seen at [facility] and [facility].  Denies fever.  Past Medical History  Diagnosis Date  . Breast feeding status of mother     currently breast feeding   Past Surgical History  Procedure Laterality Date  . Cholecystectomy  01/2008  . Cesarean section   01/03/2011    Procedure: CESAREAN SECTION;  Surgeon: Roseanna Rainbow, MD;  Location: [facility];  Service: Gynecology;  Laterality: N/A;   History reviewed. No pertinent family history. History  Substance Use Topics  . Smoking status: Never Smoker   . Smokeless tobacco: Never Used  . Alcohol Use: No   OB History   Grav Para Term Preterm Abortions TAB SAB Ect Mult Living   3 2 2  1  1   2      Review of Systems  Constitutional: Negative for fever and chills.  HENT: Negative for congestion, drooling, facial swelling and mouth sores.   Musculoskeletal: Negative for neck pain.  All other systems reviewed and are negative.    Allergies  Review of patient's allergies indicates no known allergies.  Home Medications   Current Outpatient Rx  Name  Route  Sig  Dispense  Refill  . HYDROcodone-acetaminophen (NORCO/VICODIN) 5-325 MG per tablet   Oral   Take 1 tablet by mouth every 4 (four) hours as needed for severe pain.   10 tablet   0   . penicillin v potassium (VEETID) 250 MG tablet      Take 2 tabs TID for 10 days   60 tablet   0    BP 118/85  Pulse 70  Temp(Src) 99 F (37.2 C) (Oral)  Resp 17  SpO2 100%  Breastfeeding? No Physical Exam  Constitutional: She is oriented to person, place, and  time. She appears well-developed and well-nourished.  HENT:  Head: Normocephalic and atraumatic.  Entire lower gingiva more erythematous than upper. TTP at base of numbers 22 and 27 w/o obvious abscess or caries.   Neck: Neck supple.  Cardiovascular: Normal rate.   Pulmonary/Chest: Effort normal.  Musculoskeletal: Normal range of motion.  Neurological: She is alert and oriented to person, place, and time.  Skin: Skin is warm and dry.  Psychiatric: She has a normal mood and affect.    ED Course  Procedures (including critical care time) Labs Review Labs Reviewed - No data to display Imaging Review No results found.  EKG Interpretation    Date/Time:    Ventricular  Rate:    PR Interval:    QRS Duration:   QT Interval:    QTC Calculation:   R Axis:     Text Interpretation:              MDM   1. Pain, dental    Gingival pain likely associated w/ significant peridontal disease and multiple dental abnormalities per recent dental exam and cleaning. No obvious abscess or gross caries, however obvious erythematous gingiva lower > upper. Will treat w/ Pen-V-K and a short courase of medication for pain to use in conjunction with her Tylenol and NSAIDS. Pt instructed to make arrangements for f/u at [facility] nd [facility] for referral for further dental evaluation. Pt agreeable.     Leanne Chang, NP 03/27/13 1047

## 2013-03-27 NOTE — ED Notes (Signed)
Patient complains of dental pain, went to [facility] dental clinic on 02/11/13 and was advised she had many problems

## 2013-06-15 ENCOUNTER — Emergency Department (HOSPITAL_COMMUNITY)
Admission: EM | Admit: 2013-06-15 | Discharge: 2013-06-15 | Disposition: A | Discharge status: Home / Self Care | Payer: No Typology Code available for payment source | Source: Home / Self Care | Attending: Emergency Medicine | Admitting: Emergency Medicine

## 2013-06-15 ENCOUNTER — Encounter (HOSPITAL_COMMUNITY): Payer: Self-pay | Admitting: Emergency Medicine

## 2013-06-15 DIAGNOSIS — K089 Disorder of teeth and supporting structures, unspecified: Secondary | ICD-10-CM

## 2013-06-15 DIAGNOSIS — K0889 Other specified disorders of teeth and supporting structures: Secondary | ICD-10-CM

## 2013-06-15 MED ORDER — NAPROXEN 375 MG PO TABS: 375.0000 mg | ORAL_TABLET | Freq: Two times a day (BID) | ORAL | Status: DC | PRN

## 2013-06-15 NOTE — ED Notes (Signed)
C/o dental pain, pain for one week, pain lower, left of mouth.

## 2013-06-15 NOTE — Discharge Instructions (Signed)
Dental Care and Dentist Visits °Dental care supports good overall health. Regular dental visits can also help you avoid dental pain, bleeding, infection, and other more serious health problems in the future. It is important to keep the mouth healthy because diseases in the teeth, gums, and other oral tissues can spread to other areas of the body. Some problems, such as diabetes, heart disease, and pre-term labor have been associated with poor oral health.  °See your dentist every 6 months. If you experience emergency problems such as a toothache or broken tooth, go to the dentist right away. If you see your dentist regularly, you may catch problems early. It is easier to be treated for problems in the early stages.  °WHAT TO EXPECT AT A DENTIST VISIT  °Your dentist will look for many common oral health problems and recommend proper treatment. At your regular dental visit, you can expect: °· Gentle cleaning of the teeth and gums. This includes scraping and polishing. This helps to remove the sticky substance around the teeth and gums (plaque). Plaque forms in the mouth shortly after eating. Over time, plaque hardens on the teeth as tartar. If tartar is not removed regularly, it can cause problems. Cleaning also helps remove stains. °· Periodic X-rays. These pictures of the teeth and supporting bone will help your dentist assess the health of your teeth. °· Periodic fluoride treatments. Fluoride is a natural mineral shown to help strengthen teeth. Fluoride treatment involves applying a fluoride gel or varnish to the teeth. It is most commonly done in children. °· Examination of the mouth, tongue, jaws, teeth, and gums to look for any oral health problems, such as: °· Cavities (dental caries). This is decay on the tooth caused by plaque, sugar, and acid in the mouth. It is best to catch a cavity when it is small. °· Inflammation of the gums caused by plaque buildup (gingivitis). °· Problems with the mouth or malformed  or misaligned teeth. °· Oral cancer or other diseases of the soft tissues or jaws.  °KEEP YOUR TEETH AND GUMS HEALTHY °For healthy teeth and gums, follow these general guidelines as well as your dentist's specific advice: °· Have your teeth professionally cleaned at the dentist every 6 months. °· Brush twice daily with a fluoride toothpaste. °· Floss your teeth daily.  °· Ask your dentist if you need fluoride supplements, treatments, or fluoride toothpaste. °· Eat a healthy diet. Reduce foods and drinks with added sugar. °· Avoid smoking. °TREATMENT FOR ORAL HEALTH PROBLEMS °If you have oral health problems, treatment varies depending on the conditions present in your teeth and gums. °· Your caregiver will most likely recommend good oral hygiene at each visit. °· For cavities, gingivitis, or other oral health disease, your caregiver will perform a procedure to treat the problem. This is typically done at a separate appointment. Sometimes your caregiver will refer you to another dental specialist for specific tooth problems or for surgery. °SEEK IMMEDIATE DENTAL CARE IF: °· You have pain, bleeding, or soreness in the gum, tooth, jaw, or mouth area. °· A permanent tooth becomes loose or separated from the gum socket. °· You experience a blow or injury to the mouth or jaw area. °Document Released: 12/12/2010 Document Revised: 06/24/2011 Document Reviewed: 12/12/2010 °ExitCare® Patient Information ©2014 [employer]. ° °Dental Pain °A tooth ache may be caused by cavities (tooth decay). Cavities expose the nerve of the tooth to air and hot or cold temperatures. It may come from an infection or abscess (also called a   boil or furuncle) around your tooth. It is also often caused by dental caries (tooth decay). This causes the pain you are having. °DIAGNOSIS  °Your caregiver can diagnose this problem by exam. °TREATMENT  °· If caused by an infection, it may be treated with medications which kill germs (antibiotics) and pain  medications as prescribed by your caregiver. Take medications as directed. °· Only take over-the-counter or prescription medicines for pain, discomfort, or fever as directed by your caregiver. °· Whether the tooth ache today is caused by infection or dental disease, you should see your dentist as soon as possible for further care. °SEEK MEDICAL CARE IF: °The exam and treatment you received today has been provided on an emergency basis only. This is not a substitute for complete medical or dental care. If your problem worsens or new problems (symptoms) appear, and you are unable to meet with your dentist, call or return to this location. °SEEK IMMEDIATE MEDICAL CARE IF:  °· You have a fever. °· You develop redness and swelling of your face, jaw, or neck. °· You are unable to open your mouth. °· You have severe pain uncontrolled by pain medicine. °MAKE SURE YOU:  °· Understand these instructions. °· Will watch your condition. °· Will get help right away if you are not doing well or get worse. °Document Released: 04/01/2005 Document Revised: 06/24/2011 Document Reviewed: 11/18/2007 °ExitCare® Patient Information ©2014 [employer]. ° °

## 2013-06-15 NOTE — ED Provider Notes (Signed)
Medical screening examination/treatment/procedure(s) were performed by non-physician practitioner and as supervising physician I was immediately available for consultation/collaboration.  Leslee Homeavid Mileydi Milsap, M.D.  Reuben Likesavid C Cheyenne Bordeaux, MD 06/15/13 [phone]

## 2013-06-15 NOTE — ED Provider Notes (Signed)
CSN: [id number]     Arrival date & time 06/15/13  0902 History   First MD Initiated Contact with Patient 06/15/13 [phone]     Chief Complaint  Patient presents with  . Dental Pain   (Consider location/radiation/quality/duration/timing/severity/associated sxs/prior Treatment) Patient is a 38 y.o. female presenting with tooth pain. The history is provided by the patient.  Dental Pain Location:  Lower Lower teeth location:  19/LL 1st molar, 18/LL 2nd molar and 17/LL 3rd molar Quality:  Aching Severity:  Mild Onset quality:  Gradual Duration: +chronic, several months. Timing:  Constant Chronicity:  Chronic Worsened by:  Cold food/drink and hot food/drink (+chewing)   Past Medical History  Diagnosis Date  . Breast feeding status of mother     currently breast feeding   Past Surgical History  Procedure Laterality Date  . Cholecystectomy  01/2008  . Cesarean section  01/03/2011    Procedure: CESAREAN SECTION;  Surgeon: Roseanna RainbowLisa A Jackson-Moore, MD;  Location: [facility];  Service: Gynecology;  Laterality: N/A;   No family history on file. History  Substance Use Topics  . Smoking status: Never Smoker   . Smokeless tobacco: Never Used  . Alcohol Use: No   OB History   Grav Para Term Preterm Abortions TAB SAB Ect Mult Living   3 2 2  1  1   2      Review of Systems  All other systems reviewed and are negative.    Allergies  Review of patient's allergies indicates no known allergies.  Home Medications   Current Outpatient Rx  Name  Route  Sig  Dispense  Refill  . acetaminophen (TYLENOL) 325 MG tablet   Oral   Take 650 mg by mouth every 6 (six) hours as needed.         Marland Kitchen. HYDROcodone-acetaminophen (NORCO/VICODIN) 5-325 MG per tablet   Oral   Take 1 tablet by mouth every 4 (four) hours as needed for severe pain.   10 tablet   0   . naproxen (NAPROSYN) 375 MG tablet   Oral   Take 1 tablet (375 mg total) by mouth 2 (two) times daily as needed for mild pain or moderate pain.  30 tablet   0   . penicillin v potassium (VEETID) 250 MG tablet      Take 2 tabs TID for 10 days   60 tablet   0    BP 117/79  Pulse 74  Temp(Src) 98 F (36.7 C) (Oral)  Resp 16  SpO2 96% Physical Exam  Nursing note and vitals reviewed. Constitutional: She is oriented to person, place, and time. She appears well-developed and well-nourished. No distress.  HENT:  Head: Normocephalic and atraumatic.  Right Ear: Hearing and external ear normal.  Left Ear: Hearing and external ear normal.  Nose: Nose normal.  Mouth/Throat: Uvula is midline, oropharynx is clear and moist and mucous membranes are normal.    Eyes: Conjunctivae are normal.  Neck: Normal range of motion. Neck supple.  Cardiovascular: Normal rate.   Pulmonary/Chest: Effort normal.  Musculoskeletal: Normal range of motion.  Lymphadenopathy:    She has no cervical adenopathy.  Neurological: She is alert and oriented to person, place, and time.  Skin: Skin is warm and dry. No rash noted.  Psychiatric: She has a normal mood and affect. Her behavior is normal.    ED Course  Procedures (including critical care time) Labs Review Labs Reviewed - No data to display Imaging Review No results found.   MDM  1. Pain, dental   This is a chronic issue for the patient. Provided her with resources for both charitable dental clinic in March 2015 and fee for service dental clinic in [place] for follow up.     Jess Barters [place], [place] 06/15/13 [phone]

## 2013-07-21 ENCOUNTER — Ambulatory Visit: Payer: No Typology Code available for payment source | Attending: Internal Medicine | Admitting: Internal Medicine

## 2013-07-21 ENCOUNTER — Encounter: Payer: Self-pay | Admitting: Internal Medicine

## 2013-07-21 VITALS — BP 117/82 | HR 73 | Temp 98.8°F | Resp 16 | Wt 187.6 lb

## 2013-07-21 DIAGNOSIS — K0889 Other specified disorders of teeth and supporting structures: Secondary | ICD-10-CM

## 2013-07-21 DIAGNOSIS — K089 Disorder of teeth and supporting structures, unspecified: Secondary | ICD-10-CM | POA: Insufficient documentation

## 2013-07-21 DIAGNOSIS — K029 Dental caries, unspecified: Secondary | ICD-10-CM | POA: Insufficient documentation

## 2013-07-21 DIAGNOSIS — Z139 Encounter for screening, unspecified: Secondary | ICD-10-CM

## 2013-07-21 HISTORY — DX: Dental caries, unspecified: K02.9

## 2013-07-21 LAB — CBC WITH DIFFERENTIAL/PLATELET
BASOS ABS: 0 10*3/uL (ref 0.0–0.1)
BASOS PCT: 0 % (ref 0–1)
Eosinophils Absolute: 0.2 10*3/uL (ref 0.0–0.7)
Eosinophils Relative: 2 % (ref 0–5)
HEMATOCRIT: 41.4 % (ref 36.0–46.0)
Hemoglobin: 14.5 g/dL (ref 12.0–15.0)
Lymphocytes Relative: 29 % (ref 12–46)
Lymphs Abs: 3.2 10*3/uL (ref 0.7–4.0)
MCH: 31.4 pg (ref 26.0–34.0)
MCHC: 35 g/dL (ref 30.0–36.0)
MCV: 89.6 fL (ref 78.0–100.0)
MONO ABS: 0.8 10*3/uL (ref 0.1–1.0)
Monocytes Relative: 7 % (ref 3–12)
NEUTROS ABS: 6.9 10*3/uL (ref 1.7–7.7)
NEUTROS PCT: 62 % (ref 43–77)
PLATELETS: 236 10*3/uL (ref 150–400)
RBC: 4.62 MIL/uL (ref 3.87–5.11)
RDW: 12.7 % (ref 11.5–15.5)
WBC: 11.2 10*3/uL — ABNORMAL HIGH (ref 4.0–10.5)

## 2013-07-21 LAB — COMPLETE METABOLIC PANEL WITH GFR
ALBUMIN: 4.3 g/dL (ref 3.5–5.2)
ALK PHOS: 71 U/L (ref 39–117)
ALT: 9 U/L (ref 0–35)
AST: 12 U/L (ref 0–37)
BILIRUBIN TOTAL: 0.8 mg/dL (ref 0.2–1.2)
BUN: 12 mg/dL (ref 6–23)
CALCIUM: 9.6 mg/dL (ref 8.4–10.5)
CO2: 26 mEq/L (ref 19–32)
CREATININE: 0.82 mg/dL (ref 0.50–1.10)
Chloride: 104 mEq/L (ref 96–112)
Glucose, Bld: 91 mg/dL (ref 70–99)
Potassium: 4.3 mEq/L (ref 3.5–5.3)
Sodium: 139 mEq/L (ref 135–145)
Total Protein: 6.5 g/dL (ref 6.0–8.3)

## 2013-07-21 LAB — TSH: TSH: 2.078 u[IU]/mL (ref 0.350–4.500)

## 2013-07-21 MED ORDER — NAPROXEN 500 MG PO TABS: 500.0000 mg | ORAL_TABLET | Freq: Two times a day (BID) | ORAL | Status: DC | PRN

## 2013-07-21 NOTE — Progress Notes (Signed)
Patient Demographics  Renee [name], is a 38 y.o. female  VHQ:[id number]:[id number]  UXL:[id number]  DOB - 01/10/1976  CC:  Chief Complaint  Patient presents with  . Establish Care       HPI: Renee Hansonadia Dorsainvil is a 38 y.o. female here today to establish medical care. Patient recently went to the urgent care  with dental pain has been prescribed pain medication and already finished antibiotic, patient has lot of dental cavities and has chronic problem, patient is requesting referral to see a dentist, denies any fever chills chest and shortness of breath. Patient has No headache, No chest pain, No abdominal pain - No Nausea, No new weakness tingling or numbness, No Cough - SOB.  No Known Allergies Past Medical History  Diagnosis Date  . Breast feeding status of mother     currently breast feeding   Current Outpatient Prescriptions on File Prior to Visit  Medication Sig Dispense Refill  . acetaminophen (TYLENOL) 325 MG tablet Take 650 mg by mouth every 6 (six) hours as needed.      Marland Kitchen. HYDROcodone-acetaminophen (NORCO/VICODIN) 5-325 MG per tablet Take 1 tablet by mouth every 4 (four) hours as needed for severe pain.  10 tablet  0  . penicillin v potassium (VEETID) 250 MG tablet Take 2 tabs TID for 10 days  60 tablet  0   No current facility-administered medications on file prior to visit.   History reviewed. No pertinent family history. History   Social History  . Marital Status: Married    Spouse Name: N/A    Number of Children: N/A  . Years of Education: N/A   Occupational History  . Not on file.   Social History Main Topics  . Smoking status: Never Smoker   . Smokeless tobacco: Never Used  . Alcohol Use: No  . Drug Use: No  . Sexual Activity: Yes   Other Topics Concern  . Not on file   Social History Narrative  . No narrative on file    Review of Systems: Constitutional: Negative for fever, chills, diaphoresis, activity change, appetite change and fatigue. HENT: Negative  for ear pain, nosebleeds, congestion, facial swelling, rhinorrhea, neck pain, neck stiffness and ear discharge.  Eyes: Negative for pain, discharge, redness, itching and visual disturbance. Respiratory: Negative for cough, choking, chest tightness, shortness of breath, wheezing and stridor.  Cardiovascular: Negative for chest pain, palpitations and leg swelling. Gastrointestinal: Negative for abdominal distention. Genitourinary: Negative for dysuria, urgency, frequency, hematuria, flank pain, decreased urine volume, difficulty urinating and dyspareunia.  Musculoskeletal: Negative for back pain, joint swelling, arthralgia and gait problem. Neurological: Negative for dizziness, tremors, seizures, syncope, facial asymmetry, speech difficulty, weakness, light-headedness, numbness and headaches.  Hematological: Negative for adenopathy. Does not bruise/bleed easily. Psychiatric/Behavioral: Negative for hallucinations, behavioral problems, confusion, dysphoric mood, decreased concentration and agitation.    Objective:   Filed Vitals:   07/21/13 1441  BP: 117/82  Pulse: 73  Temp: 98.8 F (37.1 C)  Resp: 16    Physical Exam: Constitutional: Patient appears well-developed and well-nourished. No distress. HENT: Normocephalic, atraumatic, External right and left ear normal. Oropharynx is clear and moist. Dental cavities  Eyes: Conjunctivae and EOM are normal. PERRLA, no scleral icterus. Neck: Normal ROM. Neck supple. No JVD. No tracheal deviation. No thyromegaly. CVS: RRR, S1/S2 +, no murmurs, no gallops, no carotid bruit.  Pulmonary: Effort and breath sounds normal, no stridor, rhonchi, wheezes, rales.  Abdominal: Soft. BS +, no distension, tenderness, rebound or guarding.  Musculoskeletal: Normal range of motion. No edema and no tenderness.  Neuro: Alert. Normal reflexes, muscle tone coordination. No cranial nerve deficit. Skin: Skin is warm and dry. No rash noted. Not diaphoretic. No  erythema. No pallor. Psychiatric: Normal mood and affect. Behavior, judgment, thought content normal.  Lab Results  Component Value Date   WBC 14.0* 01/02/2011   HGB 8.7* 01/04/2011   HCT 39.1 01/02/2011   MCV 94.4 01/02/2011   PLT 133* 01/02/2011   Lab Results  Component Value Date   CREATININE 0.87 01/04/2011   BUN 8 06/24/2010   NA 135 06/24/2010   K 3.6 06/24/2010   CL 102 06/24/2010   CO2 25 06/24/2010    No results found for this basename: HGBA1C   Lipid Panel     Component Value Date/Time   CHOL 158 02/09/2010 2020   TRIG 71 02/09/2010 2020   HDL 57 02/09/2010 2020   CHOLHDL 2.8 Ratio 02/09/2010 2020   VLDL 14 02/09/2010 2020   LDLCALC 87 02/09/2010 2020       Assessment and plan:   1. Pain, dental  - naproxen (NAPROSYN) 500 MG tablet; Take 1 tablet (500 mg total) by mouth 2 (two) times daily as needed for mild pain or moderate pain.  Dispense: 60 tablet; Refill: 1 - Ambulatory referral to Dentistry  2. Dental cavities  - Ambulatory referral to Dentistry  3. Screening Baseline blood work her  - TSH - Vit D  25 hydroxy (rtn osteoporosis monitoring) - COMPLETE METABOLIC PANEL WITH GFR - CBC with Differential  Return in about 3 months (around 10/20/2013).  Doris Cheadle, MD

## 2013-07-21 NOTE — Progress Notes (Signed)
Patient here to establish care Has recently been in ED for dental pain Is taking naproxen for the pain Needs a dental referral

## 2013-07-22 ENCOUNTER — Telehealth: Payer: Self-pay

## 2013-07-22 LAB — VITAMIN D 25 HYDROXY (VIT D DEFICIENCY, FRACTURES): VIT D 25 HYDROXY: 17 ng/mL — AB (ref 30–89)

## 2013-07-22 MED ORDER — VITAMIN D (ERGOCALCIFEROL) 1.25 MG (50000 UNIT) PO CAPS: 50000.0000 [IU] | ORAL_CAPSULE | ORAL | Status: DC

## 2013-07-22 NOTE — Telephone Encounter (Signed)
Message copied by Lestine MountJUAREZ, Lari Linson L on Thu Jul 22, 2013  2:45 PM ------      Message from: Doris CheadleADVANI, DEEPAK      Created: Thu Jul 22, 2013 11:41 AM       Blood work reviewed, noticed low vitamin D, call patient advise to start ergocalciferol 50,000 units once a week for the duration of  12 weeks.       ------

## 2013-07-22 NOTE — Telephone Encounter (Signed)
Patient not available Left message on voice mail to return our call 

## 2013-07-22 NOTE — Telephone Encounter (Signed)
Pt returning call

## 2013-07-26 ENCOUNTER — Telehealth: Payer: Self-pay

## 2013-07-26 NOTE — Telephone Encounter (Signed)
Spoke with patient and she is aware of her lab results 

## 2013-09-17 ENCOUNTER — Ambulatory Visit: Payer: No Typology Code available for payment source | Attending: Internal Medicine | Admitting: *Deleted

## 2013-09-17 VITALS — BP 107/72 | HR 82 | Temp 98.2°F

## 2013-09-17 DIAGNOSIS — J029 Acute pharyngitis, unspecified: Secondary | ICD-10-CM

## 2013-09-17 LAB — POCT RAPID STREP A (OFFICE): Rapid Strep A Screen: NEGATIVE

## 2013-09-17 MED ORDER — AMOXICILLIN-POT CLAVULANATE 875-125 MG PO TABS: 1.0000 | ORAL_TABLET | Freq: Two times a day (BID) | ORAL | Status: DC

## 2013-09-17 NOTE — Patient Instructions (Signed)
Strep Throat  Strep throat is an infection of the throat caused by a bacteria named Streptococcus pyogenes. Your caregiver may call the infection streptococcal "tonsillitis" or "pharyngitis" depending on whether there are signs of inflammation in the tonsils or back of the throat. Strep throat is most common in children aged 37 15 years during the cold months of the year, but it can occur in people of any age during any season. This infection is spread from person to person (contagious) through coughing, sneezing, or other close contact.  SYMPTOMS   · Fever or chills.  · Painful, swollen, red tonsils or throat.  · Pain or difficulty when swallowing.  · White or yellow spots on the tonsils or throat.  · Swollen, tender lymph nodes or "glands" of the neck or under the jaw.  · Red rash all over the body (rare).  DIAGNOSIS   Many different infections can cause the same symptoms. A test must be done to confirm the diagnosis so the right treatment can be given. A "rapid strep test" can help your caregiver make the diagnosis in a few minutes. If this test is not available, a light swab of the infected area can be used for a throat culture test. If a throat culture test is done, results are usually available in a day or two.  TREATMENT   Strep throat is treated with antibiotic medicine.  HOME CARE INSTRUCTIONS   · Gargle with 1 tsp of salt in 1 cup of warm water, 3 4 times per day or as needed for comfort.  · Family members who also have a sore throat or fever should be tested for strep throat and treated with antibiotics if they have the strep infection.  · Make sure everyone in your household washes their hands well.  · Do not share food, drinking cups, or personal items that could cause the infection to spread to others.  · You may need to eat a soft food diet until your sore throat gets better.  · Drink enough water and fluids to keep your urine clear or pale yellow. This will help prevent dehydration.  · Get plenty of  rest.  · Stay home from school, daycare, or work until you have been on antibiotics for 24 hours.  · Only take over-the-counter or prescription medicines for pain, discomfort, or fever as directed by your caregiver.  · If antibiotics are prescribed, take them as directed. Finish them even if you start to feel better.  SEEK MEDICAL CARE IF:   · The glands in your neck continue to enlarge.  · You develop a rash, cough, or earache.  · You cough up green, yellow-brown, or bloody sputum.  · You have pain or discomfort not controlled by medicines.  · Your problems seem to be getting worse rather than better.  SEEK IMMEDIATE MEDICAL CARE IF:   · You develop any new symptoms such as vomiting, severe headache, stiff or painful neck, chest pain, shortness of breath, or trouble swallowing.  · You develop severe throat pain, drooling, or changes in your voice.  · You develop swelling of the neck, or the skin on the neck becomes red and tender.  · You have a fever.  · You develop signs of dehydration, such as fatigue, dry mouth, and decreased urination.  · You become increasingly sleepy, or you cannot wake up completely.  Document Released: 03/29/2000 Document Revised: 03/18/2012 Document Reviewed: 05/31/2010  Exit[employer]® Patient Information ©2014 [employer].

## 2013-09-17 NOTE — Progress Notes (Unsigned)
Patient presents to clinic today c/o severe sore throat.  Stated that it started yesterday, is difficult to swallow.  Reports that her temp was 100.2 yesterday, treated herself with Ibuprofen.  Rates pain today as 8/10.  Son was just diagnosed with strep yesterday.  Will obtain throat swab and test.  Consulted with Dr. Orpah Cobb who recommends to treat patient with Augmentin.

## 2013-09-18 LAB — RAPID STREP SCREEN (MED CTR MEBANE ONLY): Streptococcus, Group A Screen (Direct): POSITIVE — AB

## 2014-02-14 ENCOUNTER — Encounter: Payer: Self-pay | Admitting: Internal Medicine

## 2014-11-17 ENCOUNTER — Emergency Department (HOSPITAL_COMMUNITY)
Admission: EM | Admit: 2014-11-17 | Discharge: 2014-11-17 | Disposition: A | Discharge status: Home / Self Care | Payer: Medicaid Other | Attending: Emergency Medicine | Admitting: Emergency Medicine

## 2014-11-17 ENCOUNTER — Encounter (HOSPITAL_COMMUNITY): Payer: Self-pay

## 2014-11-17 DIAGNOSIS — R059 Cough, unspecified: Secondary | ICD-10-CM

## 2014-11-17 DIAGNOSIS — R05 Cough: Secondary | ICD-10-CM | POA: Diagnosis not present

## 2014-11-17 DIAGNOSIS — H73011 Bullous myringitis, right ear: Secondary | ICD-10-CM | POA: Insufficient documentation

## 2014-11-17 DIAGNOSIS — R0602 Shortness of breath: Secondary | ICD-10-CM | POA: Diagnosis not present

## 2014-11-17 MED ORDER — HYDROCODONE-ACETAMINOPHEN 7.5-325 MG/15ML PO SOLN: 10.0000 mL | Freq: Four times a day (QID) | ORAL | Status: AC | PRN

## 2014-11-17 MED ORDER — BENZONATATE 100 MG PO CAPS: 100.0000 mg | ORAL_CAPSULE | Freq: Three times a day (TID) | ORAL | Status: DC

## 2014-11-17 MED ORDER — AZITHROMYCIN 250 MG PO TABS: 250.0000 mg | ORAL_TABLET | Freq: Every day | ORAL | Status: DC

## 2014-11-17 NOTE — Discharge Instructions (Signed)
Bullous Myringitis You have an infection of the ear drum called myringitis. Bullous means blisters have formed. These can produce clear or slightly bloody ear drainage. This infection can be caused by both viruses and germs (bacteria). Symptoms of myringitis include severe earache, slight hearing loss, and clear or bloody drainage from the ear. It is different from most ear infections in that there is no fluid trapped behind the ear drum. Treatment often includes antibiotic ear drops and pain medicine. Sometimes an oral antibiotic may be prescribed. Until the infection is resolved, you should keep water from entering your ear by plugging it with a cotton ball covered with petroleum jelly when you shower.  SEEK MEDICAL CARE IF:  You develop a cough or other symptoms of a respiratory infection.  Your symptoms are not improving after 2 days of treatment.  You develop a severe headache or stiff neck. Document Released: 05/09/2004 Document Revised: 06/24/2011 Document Reviewed: 03/29/2008 [facility] Patient Information 2015 [place], [place]. This information is not intended to replace advice given to you by your health care provider. Make sure you discuss any questions you have with your health care provider.  Cough, Adult  A cough is a reflex that helps clear your throat and airways. It can help heal the body or may be a reaction to an irritated airway. A cough may only last 2 or 3 weeks (acute) or may last more than 8 weeks (chronic).  CAUSES Acute cough:  Viral or bacterial infections. Chronic cough:  Infections.  Allergies.  Asthma.  Post-nasal drip.  Smoking.  Heartburn or acid reflux.  Some medicines.  Chronic lung problems (COPD).  Cancer. SYMPTOMS   Cough.  Fever.  Chest pain.  Increased breathing rate.  High-pitched whistling sound when breathing (wheezing).  Colored mucus that you cough up (sputum). TREATMENT   A bacterial cough may be treated with antibiotic  medicine.  A viral cough must run its course and will not respond to antibiotics.  Your caregiver may recommend other treatments if you have a chronic cough. HOME CARE INSTRUCTIONS   Only take over-the-counter or prescription medicines for pain, discomfort, or fever as directed by your caregiver. Use cough suppressants only as directed by your caregiver.  Use a cold steam vaporizer or humidifier in your bedroom or home to help loosen secretions.  Sleep in a semi-upright position if your cough is worse at night.  Rest as needed.  Stop smoking if you smoke. SEEK IMMEDIATE MEDICAL CARE IF:   You have pus in your sputum.  Your cough starts to worsen.  You cannot control your cough with suppressants and are losing sleep.  You begin coughing up blood.  You have difficulty breathing.  You develop pain which is getting worse or is uncontrolled with medicine.  You have a fever. MAKE SURE YOU:   Understand these instructions.  Will watch your condition.  Will get help right away if you are not doing well or get worse. Document Released: 09/28/2010 Document Revised: 06/24/2011 Document Reviewed: 09/28/2010 [facility] Patient Information 2015 [place], [place]. This information is not intended to replace advice given to you by your health care provider. Make sure you discuss any questions you have with your health care provider.

## 2014-11-17 NOTE — ED Provider Notes (Signed)
CSN: [id number]     Arrival date & time 11/17/14  1736 History   First MD Initiated Contact with Patient 11/17/14 1802     Chief Complaint  Patient presents with  . Cough  . Emesis      HPI  Patient reevaluation of cough and headache. Started with "cold" in typical runny nose sinus congestion sore throat and minimal cough week ago. This has improved with over-the-counter medicines. She developed some pain in her right ear. Also has a persistent cough. Is unable to sleep the last 2 nights because of persistence of her cough. Dry nonproductive. No body aches or fevers. No GI complaints. However, she did cough once to the point of emesis.  Past Medical History  Diagnosis Date  . Breast feeding status of mother     currently breast feeding   Past Surgical History  Procedure Laterality Date  . Cholecystectomy  01/2008  . Cesarean section  01/03/2011    Procedure: CESAREAN SECTION;  Surgeon: Roseanna Rainbow, MD;  Location: [facility];  Service: Gynecology;  Laterality: N/A;   History reviewed. No pertinent family history. History  Substance Use Topics  . Smoking status: Never Smoker   . Smokeless tobacco: Never Used  . Alcohol Use: No   OB History    Gravida Para Term Preterm AB TAB SAB Ectopic Multiple Living   3 2 2  1  1   2      Review of Systems  Constitutional: Negative for fever, chills, diaphoresis, appetite change and fatigue.  HENT: Positive for ear pain. Negative for mouth sores, sore throat and trouble swallowing.   Eyes: Negative for visual disturbance.  Respiratory: Positive for cough and shortness of breath. Negative for chest tightness and wheezing.   Cardiovascular: Negative for chest pain.  Gastrointestinal: Negative for nausea, vomiting, abdominal pain, diarrhea and abdominal distention.  Endocrine: Negative for polydipsia, polyphagia and polyuria.  Genitourinary: Negative for dysuria, frequency and hematuria.  Musculoskeletal: Negative for gait problem.    Skin: Negative for color change, pallor and rash.  Neurological: Negative for dizziness, syncope, light-headedness and headaches.  Hematological: Does not bruise/bleed easily.  Psychiatric/Behavioral: Negative for behavioral problems and confusion.      Allergies  Review of patient's allergies indicates no known allergies.  Home Medications   Prior to Admission medications   Medication Sig Start Date End Date Taking? Authorizing Provider  Nutritional Supplements (EQUATE PO) Take 1 tablet by mouth daily as needed (congestion).   Yes Historical Provider, MD  sodium-potassium bicarbonate (ALKA-SELTZER GOLD) TBEF dissolvable tablet Take 1 tablet by mouth daily as needed (indigestion).   Yes Historical Provider, MD  acetaminophen (TYLENOL) 325 MG tablet Take 650 mg by mouth every 6 (six) hours as needed.    Historical Provider, MD  amoxicillin-clavulanate (AUGMENTIN) 875-125 MG per tablet Take 1 tablet by mouth 2 (two) times daily. Patient not taking: Reported on 11/17/2014 09/17/13   Doris Cheadle, MD  azithromycin (ZITHROMAX Z-PAK) 250 MG tablet Take 1 tablet (250 mg total) by mouth daily. 11/17/14   Rolland Porter, MD  benzonatate (TESSALON) 100 MG capsule Take 1 capsule (100 mg total) by mouth every 8 (eight) hours. 11/17/14   Rolland Porter, MD  HYDROcodone-acetaminophen (HYCET) 7.5-325 mg/15 ml solution Take 10 mLs by mouth 4 (four) times daily as needed (Pain or cough). 11/17/14 11/17/15  Rolland Porter, MD  naproxen (NAPROSYN) 500 MG tablet Take 1 tablet (500 mg total) by mouth 2 (two) times daily as needed for mild pain  or moderate pain. Patient not taking: Reported on 11/17/2014 07/21/13   Doris Cheadle, MD  penicillin v potassium (VEETID) 250 MG tablet Take 2 tabs TID for 10 days Patient not taking: Reported on 11/17/2014 03/27/13   Roma Kayser Schorr, NP  Vitamin D, Ergocalciferol, (DRISDOL) 50000 UNITS CAPS capsule Take 1 capsule (50,000 Units total) by mouth every 7 (seven) days. Patient not taking:  Reported on 11/17/2014 07/22/13   Doris Cheadle, MD   BP 117/62 mmHg  Pulse 78  Temp(Src) 98.3 F (36.8 C) (Oral)  Resp 20  SpO2 100%  LMP 10/27/2014 Physical Exam  Constitutional: She is oriented to person, place, and time. She appears well-developed and well-nourished. No distress.  HENT:  Head: Normocephalic.  Ears:  Eyes: Conjunctivae are normal. Pupils are equal, round, and reactive to light. No scleral icterus.  Neck: Normal range of motion. Neck supple. No thyromegaly present.  Cardiovascular: Normal rate and regular rhythm.  Exam reveals no gallop and no friction rub.   No murmur heard. Pulmonary/Chest: Effort normal and breath sounds normal. No respiratory distress. She has no wheezes. She has no rales.  Abdominal: Soft. Bowel sounds are normal. She exhibits no distension. There is no tenderness. There is no rebound.  Musculoskeletal: Normal range of motion.  Neurological: She is alert and oriented to person, place, and time.  Skin: Skin is warm and dry. No rash noted.  Psychiatric: She has a normal mood and affect. Her behavior is normal.    ED Course  Procedures (including critical care time) Labs Review Labs Reviewed - No data to display  Imaging Review No results found.   EKG Interpretation None      MDM   Final diagnoses:  Bullous myringitis, right  Cough    Mother has a 7, and 2 and a half-year-old with her. The 66/90-year-old has been the doctor with cough cold several times over the last several weeks. With her bullous changes in her right ear and cough, I  will treat with Zithromax. Cough suppressants. Primary care follow-up.    Rolland Porter, MD 11/17/14 [phone]

## 2014-11-17 NOTE — ED Notes (Signed)
Pt c/o cough and headache x 1 day.  Pain score 8/10.  Pt reports she coughs so hard that is causes her to vomit.  Denies GU and GI complaints.  Sts she has been taking OTC medication w/o relief.

## 2015-01-10 ENCOUNTER — Emergency Department (HOSPITAL_COMMUNITY)
Admission: EM | Admit: 2015-01-10 | Discharge: 2015-01-11 | Disposition: A | Discharge status: Home / Self Care | Payer: Medicaid Other | Attending: Emergency Medicine | Admitting: Emergency Medicine

## 2015-01-10 ENCOUNTER — Encounter (HOSPITAL_COMMUNITY): Payer: Self-pay | Admitting: Emergency Medicine

## 2015-01-10 DIAGNOSIS — N898 Other specified noninflammatory disorders of vagina: Secondary | ICD-10-CM | POA: Insufficient documentation

## 2015-01-10 DIAGNOSIS — Z79899 Other long term (current) drug therapy: Secondary | ICD-10-CM | POA: Diagnosis not present

## 2015-01-10 DIAGNOSIS — N760 Acute vaginitis: Secondary | ICD-10-CM

## 2015-01-10 MED ORDER — IBUPROFEN 200 MG PO TABS
600.0000 mg | ORAL_TABLET | Freq: Once | ORAL | Status: AC
  Administered 2015-01-11: 600 mg via ORAL
  Filled 2015-01-10: qty 3

## 2015-01-10 NOTE — ED Notes (Signed)
Pt states that she was dx with a  Yeast infection and has been taking her medication for it but states that she is still having intense itching and burning. Alert and oriented.

## 2015-01-11 LAB — CBC WITH DIFFERENTIAL/PLATELET
Basophils Absolute: 0 K/uL (ref 0.0–0.1)
Basophils Relative: 0 %
Eosinophils Absolute: 0.2 K/uL (ref 0.0–0.7)
Eosinophils Relative: 2 %
HCT: 42.4 % (ref 36.0–46.0)
Hemoglobin: 15.1 g/dL — ABNORMAL HIGH (ref 12.0–15.0)
Lymphocytes Relative: 33 %
Lymphs Abs: 3.8 K/uL (ref 0.7–4.0)
MCH: 32.1 pg (ref 26.0–34.0)
MCHC: 35.6 g/dL (ref 30.0–36.0)
MCV: 90 fL (ref 78.0–100.0)
Monocytes Absolute: 0.9 K/uL (ref 0.1–1.0)
Monocytes Relative: 8 %
Neutro Abs: 6.6 K/uL (ref 1.7–7.7)
Neutrophils Relative %: 57 %
Platelets: 204 K/uL (ref 150–400)
RBC: 4.71 MIL/uL (ref 3.87–5.11)
RDW: 12 % (ref 11.5–15.5)
WBC: 11.5 K/uL — ABNORMAL HIGH (ref 4.0–10.5)

## 2015-01-11 LAB — URINALYSIS, ROUTINE W REFLEX MICROSCOPIC
Bilirubin Urine: NEGATIVE
Glucose, UA: NEGATIVE mg/dL
Hgb urine dipstick: NEGATIVE
Ketones, ur: NEGATIVE mg/dL
Leukocytes, UA: NEGATIVE
Nitrite: NEGATIVE
Protein, ur: NEGATIVE mg/dL
Specific Gravity, Urine: 1.02 (ref 1.005–1.030)
Urobilinogen, UA: 0.2 mg/dL (ref 0.0–1.0)
pH: 5.5 (ref 5.0–8.0)

## 2015-01-11 LAB — BASIC METABOLIC PANEL WITH GFR
Anion gap: 5 (ref 5–15)
BUN: 13 mg/dL (ref 6–20)
CO2: 25 mmol/L (ref 22–32)
Calcium: 9 mg/dL (ref 8.9–10.3)
Chloride: 108 mmol/L (ref 101–111)
Creatinine, Ser: 0.85 mg/dL (ref 0.44–1.00)
GFR calc Af Amer: >60 mL/min
GFR calc non Af Amer: >60 mL/min
Glucose, Bld: 103 mg/dL — ABNORMAL HIGH (ref 65–99)
Potassium: 4 mmol/L (ref 3.5–5.1)
Sodium: 138 mmol/L (ref 135–145)

## 2015-01-11 LAB — GC/CHLAMYDIA PROBE AMP (~~LOC~~) NOT AT ARMC
Chlamydia: NEGATIVE
Neisseria Gonorrhea: NEGATIVE

## 2015-01-11 LAB — WET PREP, GENITAL
Clue Cells Wet Prep HPF POC: NONE SEEN
Trich, Wet Prep: NONE SEEN
Yeast Wet Prep HPF POC: NONE SEEN

## 2015-01-11 LAB — HIV ANTIBODY (ROUTINE TESTING W REFLEX): HIV Screen 4th Generation wRfx: NONREACTIVE

## 2015-01-11 MED ORDER — CEFTRIAXONE SODIUM 250 MG IJ SOLR
INTRAMUSCULAR | Status: AC
  Administered 2015-01-11: 250 mg via INTRAMUSCULAR
  Filled 2015-01-11: qty 250

## 2015-01-11 MED ORDER — CEFTRIAXONE SODIUM 250 MG IJ SOLR
250.0000 mg | Freq: Once | INTRAMUSCULAR | Status: AC
  Administered 2015-01-11: 250 mg via INTRAMUSCULAR

## 2015-01-11 MED ORDER — AZITHROMYCIN 250 MG PO TABS: 500.0000 mg | ORAL_TABLET | Freq: Once | ORAL | Status: DC

## 2015-01-11 MED ORDER — GENTAMICIN SULFATE 40 MG/ML IJ SOLN: 240.0000 mg | Freq: Once | INTRAMUSCULAR | Status: DC

## 2015-01-11 MED ORDER — CLOTRIMAZOLE 1 % VA CREA
1.0000 | TOPICAL_CREAM | Freq: Every day | VAGINAL | Status: DC
  Administered 2015-01-11: 1 via VAGINAL
  Filled 2015-01-11: qty 45

## 2015-01-11 MED ORDER — FLUCONAZOLE 200 MG PO TABS: 200.0000 mg | ORAL_TABLET | Freq: Every day | ORAL | Status: AC

## 2015-01-11 MED ORDER — AZITHROMYCIN 250 MG PO TABS
1000.0000 mg | ORAL_TABLET | Freq: Once | ORAL | Status: AC
  Administered 2015-01-11: 1000 mg via ORAL
  Filled 2015-01-11: qty 4

## 2015-01-11 MED ORDER — CEFTRIAXONE SODIUM 1 G IJ SOLR
1.0000 g | Freq: Once | INTRAMUSCULAR | Status: DC
  Filled 2015-01-11: qty 10

## 2015-01-11 NOTE — ED Provider Notes (Signed)
CSN: [id number]     Arrival date & time 01/10/15  1928 History   First MD Initiated Contact with Patient 01/10/15 2129     Chief Complaint  Patient presents with  . Vaginal Itching     (Consider location/radiation/quality/duration/timing/severity/associated sxs/prior Treatment) HPI Comments: Renee [name] is a 39 y/o F with no significant past medical history who presents the emergency department today complaining of vaginal itching since Saturday. Patient states she has had extreme vaginal itching since Saturday she was seen by her physician on Monday who prescribed her Diflucan and nystatin. Patient has had no relief. Patient states she do not have pelvic exam performed at last doctor visit. Denies vaginal discharge, abdominal pain, vaginal bleeding, fever, back pain, dyspareunia, dysuria. Patient is sexually active with one partner. Does not use protection.  Patient is a 39 y.o. female presenting with vaginal itching. The history is provided by the patient.  Vaginal Itching    Past Medical History  Diagnosis Date  . Breast feeding status of mother     currently breast feeding   Past Surgical History  Procedure Laterality Date  . Cholecystectomy  01/2008  . Cesarean section  01/03/2011    Procedure: CESAREAN SECTION;  Surgeon: Roseanna Rainbow, MD;  Location: [facility];  Service: Gynecology;  Laterality: N/A;   History reviewed. No pertinent family history. Social History  Substance Use Topics  . Smoking status: Never Smoker   . Smokeless tobacco: Never Used  . Alcohol Use: No   OB History    Gravida Para Term Preterm AB TAB SAB Ectopic Multiple Living   Review of Systems  All other systems reviewed and are negative.     Allergies  Review of patient's allergies indicates no known allergies.  Home Medications   Prior to Admission medications   Medication Sig Start Date End Date Taking? Authorizing Provider  acetaminophen (TYLENOL) 325 MG tablet  Take 650 mg by mouth every 6 (six) hours as needed.   Yes Historical Provider, MD  [facility] [address] vaginally once.   Yes Historical Provider, MD  fluconazole (DIFLUCAN) 150 MG tablet Take 150 mg by mouth daily.   Yes Historical Provider, MD  nystatin cream (MYCOSTATIN) Apply 1 application topically 3 (three) times daily.   Yes Historical Provider, MD  amoxicillin-clavulanate (AUGMENTIN) 875-125 MG per tablet Take 1 tablet by mouth 2 (two) times daily. Patient not taking: Reported on 11/17/2014 09/17/13   Doris Cheadle, MD  azithromycin (ZITHROMAX Z-PAK) 250 MG tablet Take 1 tablet (250 mg total) by mouth daily. Patient not taking: Reported on 01/10/2015 11/17/14   Rolland Porter, MD  benzonatate (TESSALON) 100 MG capsule Take 1 capsule (100 mg total) by mouth every 8 (eight) hours. Patient not taking: Reported on 01/10/2015 11/17/14   Rolland Porter, MD  fluconazole (DIFLUCAN) 200 MG tablet Take 1 tablet (200 mg total) by mouth daily. 01/11/15 01/18/15  Vivan Agostino Tripp Beyza Bellino, PA-C  HYDROcodone-acetaminophen (HYCET) 7.5-325 mg/15 ml solution Take 10 mLs by mouth 4 (four) times daily as needed (Pain or cough). Patient not taking: Reported on 01/10/2015 11/17/14 11/17/15  Rolland Porter, MD  naproxen (NAPROSYN) 500 MG tablet Take 1 tablet (500 mg total) by mouth 2 (two) times daily as needed for mild pain or moderate pain. Patient not taking: Reported on 11/17/2014 07/21/13   Doris Cheadle, MD  penicillin v potassium (VEETID) 250 MG tablet Take 2 tabs TID for  10 days Patient not taking: Reported on 11/17/2014 03/27/13   Roma Kayser Schorr, NP  Vitamin D, Ergocalciferol, (DRISDOL) 50000 UNITS CAPS capsule Take 1 capsule (50,000 Units total) by mouth every 7 (seven) days. Patient not taking: Reported on 11/17/2014 07/22/13   Doris Cheadle, MD   BP 117/65 mmHg  Pulse 63  Temp(Src) 98 F (36.7 C) (Oral)  Resp 16  SpO2 97%  LMP 12/16/2014 Physical Exam  Constitutional: She is oriented to person, place, and time. She  appears well-developed and well-nourished. No distress.  HENT:  Head: Normocephalic and atraumatic.  Mouth/Throat: No oropharyngeal exudate.  Eyes: Conjunctivae and EOM are normal. Pupils are equal, round, and reactive to light. Right eye exhibits no discharge. Left eye exhibits no discharge. No scleral icterus.  Cardiovascular: Normal rate, regular rhythm, normal heart sounds and intact distal pulses.  Exam reveals no gallop and no friction rub.   No murmur heard. Pulmonary/Chest: Effort normal and breath sounds normal. No respiratory distress. She has no wheezes. She has no rales. She exhibits no tenderness.  Abdominal: Soft. Bowel sounds are normal. She exhibits no distension and no mass. There is no tenderness. There is no rebound and no guarding.  Genitourinary: Vaginal discharge found.  Patient with excoriated skin to external vagina. Several calls close, IUD strings intact. Significant white discharge in vaginal vault. Patient felt exquisite pain during pelvic exam.  Musculoskeletal: Normal range of motion. She exhibits no edema or tenderness.  Neurological: She is alert and oriented to person, place, and time. No cranial nerve deficit.  Strength 5/5 throughout. No sensory deficits.    Skin: Skin is warm and dry. No rash noted. She is not diaphoretic. No erythema. No pallor.  Psychiatric: She has a normal mood and affect. Her behavior is normal.  Nursing note and vitals reviewed.   ED Course  Procedures (including critical care time) Labs Review Labs Reviewed  WET PREP, GENITAL - Abnormal; Notable for the following:    WBC, Wet Prep HPF POC TOO NUMEROUS TO COUNT (*)    All other components within normal limits  BASIC METABOLIC PANEL - Abnormal; Notable for the following:    Glucose, Bld 103 (*)    All other components within normal limits  CBC WITH DIFFERENTIAL/PLATELET - Abnormal; Notable for the following:    WBC 11.5 (*)    Hemoglobin 15.1 (*)    All other components within  normal limits  URINE CULTURE  URINALYSIS, ROUTINE W REFLEX MICROSCOPIC (NOT AT [facility])  HIV ANTIBODY (ROUTINE TESTING)  GC/CHLAMYDIA PROBE AMP ([place]) NOT AT [facility]    Imaging Review No results found. I have personally reviewed and evaluated these images and lab results as part of my medical decision-making.   EKG Interpretation None      MDM   Final diagnoses:  Vaginal infection    Patient seen in emergency department for vaginal itching unresponsive to treatment with Diflucan and nystatin. Wet prep revealed to be BC too numerous to count. UA negative. We'll treat prophylactically for chlamydia and gonorrhea with azithromycin and ceftriaxone. Patient given clotrimazole cream for relief and for potential yeast. No CMT. Do not suspect PID. Vital signs stable discussed plan with patient who is agreeable. Return precautions outlined in patient discharge instructions.    Lester Kinsman Parkline, PA-C 01/11/15 [id number]  Lyndal Pulley, MD 01/11/15 [phone]

## 2015-01-11 NOTE — Discharge Instructions (Signed)
Vaginitis Vaginitis is an inflammation of the vagina. It can happen when the normal bacteria and yeast in the vagina grow too much. There are different types. Treatment will depend on the type you have. HOME CARE  Take all medicines as told by your doctor.  Keep your vagina area clean and dry. Avoid soap. Rinse the area with water.  Avoid washing and cleaning out the vagina (douching).  Do not use tampons or have sex (intercourse) until your treatment is done.  Wipe from front to back after going to the restroom.  Wear cotton underwear.  Avoid wearing underwear while you sleep until your vaginitis is gone.  Avoid tight pants. Avoid underwear or nylons without a cotton panel.  Take off wet clothing (such as a bathing suit) as soon as you can.  Use mild, unscented products. Avoid fabric softeners and scented:  Feminine sprays.  Laundry detergents.  Tampons.  Soaps or bubble baths.  Practice safe sex and use condoms. GET HELP RIGHT AWAY IF:   You have belly (abdominal) pain.  You have a fever or lasting symptoms for more than 2-3 days.  You have a fever and your symptoms suddenly get worse. MAKE SURE YOU:   Understand these instructions.  Will watch this condition.  Will get help right away if you are not doing well or get worse. Document Released: 06/28/2008 Document Revised: 12/25/2011 Document Reviewed: 09/12/2011 [facility] Patient Information 2015 [place], [place]. This information is not intended to replace advice given to you by your health care provider. Make sure you discuss any questions you have with your health care provider.  Apply cream daily for 7 days. Take Diflucan as prescribed. Follow up and 3-4 days if symptoms are not improved. Return to the emergency department if you experience vaginal bleeding, vaginal discharge, abdominal pain, fevers, vomiting.

## 2015-01-12 ENCOUNTER — Telehealth (HOSPITAL_BASED_OUTPATIENT_CLINIC_OR_DEPARTMENT_OTHER): Payer: Self-pay | Admitting: Emergency Medicine

## 2015-01-12 LAB — URINE CULTURE: Culture: NO GROWTH

## 2015-07-13 ENCOUNTER — Encounter (HOSPITAL_COMMUNITY): Payer: Self-pay | Admitting: *Deleted

## 2015-07-13 ENCOUNTER — Emergency Department (HOSPITAL_COMMUNITY)
Admission: EM | Admit: 2015-07-13 | Discharge: 2015-07-13 | Disposition: A | Discharge status: Home / Self Care | Payer: Medicaid Other | Source: Home / Self Care

## 2015-07-13 DIAGNOSIS — J069 Acute upper respiratory infection, unspecified: Secondary | ICD-10-CM

## 2015-07-13 DIAGNOSIS — R059 Cough, unspecified: Secondary | ICD-10-CM

## 2015-07-13 DIAGNOSIS — R0989 Other specified symptoms and signs involving the circulatory and respiratory systems: Secondary | ICD-10-CM | POA: Diagnosis not present

## 2015-07-13 DIAGNOSIS — R05 Cough: Secondary | ICD-10-CM | POA: Diagnosis not present

## 2015-07-13 MED ORDER — HYDROCODONE-ACETAMINOPHEN 7.5-325 MG/15ML PO SOLN
ORAL | Status: DC
Start: 2015-07-13 — End: 2016-05-22

## 2015-07-13 MED ORDER — FLUTICASONE PROPIONATE 50 MCG/ACT NA SUSP: 2.0000 | Freq: Every day | NASAL | Status: DC

## 2015-07-13 NOTE — ED Notes (Signed)
Pt  Reports       Symptoms  Of  Cough   Cold  Congestion   With  Body  Aches    Pt     Reports        Symptoms    Started            3  Days  Ago           pt  Reports    She  Has  Tried  otc  meds  Without releif

## 2015-07-13 NOTE — Discharge Instructions (Signed)
Allergies An allergy is an abnormal reaction to a substance by the body's defense system (immune system). Allergies can develop at any age. WHAT CAUSES ALLERGIES? An allergic reaction happens when the immune system mistakenly reacts to a normally harmless substance, called an allergen, as if it were harmful. The immune system releases antibodies to fight the substance. Antibodies eventually release a chemical called histamine into the bloodstream. The release of histamine is meant to protect the body from infection, but it also causes discomfort. An allergic reaction can be triggered by:  Eating an allergen.  Inhaling an allergen.  Touching an allergen. WHAT TYPES OF ALLERGIES ARE THERE? There are many types of allergies. Common types include:  Seasonal allergies. People with this type of allergy are usually allergic to substances that are only present during certain seasons, such as molds and pollens.  Food allergies.  Drug allergies.  Insect allergies.  Animal dander allergies. WHAT ARE SYMPTOMS OF ALLERGIES? Possible allergy symptoms include:  Swelling of the lips, face, tongue, mouth, or throat.  Sneezing, coughing, or wheezing.  Nasal congestion.  Tingling in the mouth.  Rash.  Itching.  Itchy, red, swollen areas of skin (hives).  Watery eyes.  Vomiting.  Diarrhea.  Dizziness.  Lightheadedness.  Fainting.  Trouble breathing or swallowing.  Chest tightness.  Rapid heartbeat. HOW ARE ALLERGIES DIAGNOSED? Allergies are diagnosed with a medical and family history and one or more of the following:  Skin tests.  Blood tests.  A food diary. A food diary is a record of all the foods and drinks you have in a day and of all the symptoms you experience.  The results of an elimination diet. An elimination diet involves eliminating foods from your diet and then adding them back in one by one to find out if a certain food causes an allergic reaction. HOW ARE  ALLERGIES TREATED? There is no cure for allergies, but allergic reactions can be treated with medicine. Severe reactions usually need to be treated at a hospital. HOW CAN REACTIONS BE PREVENTED? The best way to prevent an allergic reaction is by avoiding the substance you are allergic to. Allergy shots and medicines can also help prevent reactions in some cases. People with severe allergic reactions may be able to prevent a life-threatening reaction called anaphylaxis with a medicine given right after exposure to the allergen.   This information is not intended to replace advice given to you by your health care provider. Make sure you discuss any questions you have with your health care provider.   Document Released: 06/25/2002 Document Revised: 04/22/2014 Document Reviewed: 01/11/2014 Elsevier Interactive Patient Education 2016 [employer] Cough, Adult Coughing is a reflex that clears your throat and your airways. Coughing helps to heal and protect your lungs. It is normal to cough occasionally, but a cough that happens with other symptoms or lasts a long time may be a sign of a condition that needs treatment. A cough may last only 2-3 weeks (acute), or it may last longer than 8 weeks (chronic). CAUSES Coughing is commonly caused by:  Breathing in substances that irritate your lungs.  A viral or bacterial respiratory infection.  Allergies.  Asthma.  Postnasal drip.  Smoking.  Acid backing up from the stomach into the esophagus (gastroesophageal reflux).  Certain medicines.  Chronic lung problems, including COPD (or rarely, lung cancer).  Other medical conditions such as heart failure. HOME CARE INSTRUCTIONS  Pay attention to any changes in your symptoms. Take these actions to help   with your discomfort:  Take medicines only as told by your health care provider.  If you were prescribed an antibiotic medicine, take it as told by your health care provider. Do not stop taking the  antibiotic even if you start to feel better.  Talk with your health care provider before you take a cough suppressant medicine.  Drink enough fluid to keep your urine clear or pale yellow.  If the air is dry, use a cold steam vaporizer or humidifier in your bedroom or your home to help loosen secretions.  Avoid anything that causes you to cough at work or at home.  If your cough is worse at night, try sleeping in a semi-upright position.  Avoid cigarette smoke. If you smoke, quit smoking. If you need help quitting, ask your health care provider.  Avoid caffeine.  Avoid alcohol.  Rest as needed. SEEK MEDICAL CARE IF:   You have new symptoms.  You cough up pus.  Your cough does not get better after 2-3 weeks, or your cough gets worse.  You cannot control your cough with suppressant medicines and you are losing sleep.  You develop pain that is getting worse or pain that is not controlled with pain medicines.  You have a fever.  You have unexplained weight loss.  You have night sweats. SEEK IMMEDIATE MEDICAL CARE IF:  You cough up blood.  You have difficulty breathing.  Your heartbeat is very fast.   This information is not intended to replace advice given to you by your health care provider. Make sure you discuss any questions you have with your health care provider.   Document Released: 09/28/2010 Document Revised: 12/21/2014 Document Reviewed: 06/08/2014 Elsevier Interactive Patient Education 2016 [employer] Inc[employer]  

## 2015-07-13 NOTE — ED Provider Notes (Signed)
CSN: [id number]     Arrival date & time 07/13/15  1257 History   None    No chief complaint on file.  (Consider location/radiation/quality/duration/timing/severity/associated sxs/prior Treatment) HPI History obtained from patient:  Pt presents with the cc of: cough, cold symptoms Duration of symptoms:several days Treatment prior to arrival:OTC meds without relief Context:sudden onset, children have been ill at home Other symptoms include:unable to sleep Pain score:0  Past Medical History  Diagnosis Date  . Breast feeding status of mother     currently breast feeding   Past Surgical History  Procedure Laterality Date  . Cholecystectomy  01/2008  . Cesarean section  01/03/2011    Procedure: CESAREAN SECTION;  Surgeon: Roseanna RainbowLisa A Jackson-Moore, MD;  Location: [facility];  Service: Gynecology;  Laterality: N/A;   No family history on file. Social History  Substance Use Topics  . Smoking status: Never Smoker   . Smokeless tobacco: Never Used  . Alcohol Use: No   OB History    Gravida Para Term Preterm AB TAB SAB Ectopic Multiple Living   3 2 2  1  1   2      Review of Systems Cold symptoms Allergies  Review of patient's allergies indicates no known allergies.  Home Medications   Prior to Admission medications   Medication Sig Start Date End Date Taking? Authorizing Provider  acetaminophen (TYLENOL) 325 MG tablet Take 650 mg by mouth every 6 (six) hours as needed.    Historical Provider, MD  amoxicillin-clavulanate (AUGMENTIN) 875-125 MG per tablet Take 1 tablet by mouth 2 (two) times daily. Patient not taking: Reported on 11/17/2014 09/17/13   Doris Cheadleeepak Advani, MD  azithromycin (ZITHROMAX Z-PAK) 250 MG tablet Take 1 tablet (250 mg total) by mouth daily. Patient not taking: Reported on 01/10/2015 11/17/14   Rolland PorterMark James, MD  benzonatate (TESSALON) 100 MG capsule Take 1 capsule (100 mg total) by mouth every 8 (eight) hours. Patient not taking: Reported on 01/10/2015 11/17/14   Rolland PorterMark James, MD   [facility] [address] vaginally once.    Historical Provider, MD  fluconazole (DIFLUCAN) 150 MG tablet Take 150 mg by mouth daily.    Historical Provider, MD  HYDROcodone-acetaminophen (HYCET) 7.5-325 mg/15 ml solution Take 10 mLs by mouth 4 (four) times daily as needed (Pain or cough). Patient not taking: Reported on 01/10/2015 11/17/14 11/17/15  Rolland PorterMark James, MD  naproxen (NAPROSYN) 500 MG tablet Take 1 tablet (500 mg total) by mouth 2 (two) times daily as needed for mild pain or moderate pain. Patient not taking: Reported on 11/17/2014 07/21/13   Doris Cheadleeepak Advani, MD  nystatin cream (MYCOSTATIN) Apply 1 application topically 3 (three) times daily.    Historical Provider, MD  penicillin v potassium (VEETID) 250 MG tablet Take 2 tabs TID for 10 days Patient not taking: Reported on 11/17/2014 03/27/13   Roma KayserKatherine P Schorr, NP  Vitamin D, Ergocalciferol, (DRISDOL) 50000 UNITS CAPS capsule Take 1 capsule (50,000 Units total) by mouth every 7 (seven) days. Patient not taking: Reported on 11/17/2014 07/22/13   Doris Cheadleeepak Advani, MD   Meds Ordered and Administered this Visit  Medications - No data to display  There were no vitals taken for this visit. No data found.   Physical Exam NURSES NOTES AND VITAL SIGNS REVIEWED. CONSTITUTIONAL: Well developed, well nourished, no acute distress HEENT: normocephalic, atraumatic, right and left TM's are normal EYES: Conjunctiva normal NECK:normal ROM, supple, no adenopathy PULMONARY:No respiratory distress, normal effort, Lungs: CTAb/l, no wheezes, or increased work of  breathing CARDIOVASCULAR: RRR, no murmur ABDOMEN: soft, ND, NT, +'ve BS MUSCULOSKELETAL: Normal ROM of all extremities,  SKIN: warm and dry without rash PSYCHIATRIC: Mood and affect, behavior are normal  ED Course  Procedures (including critical care time)  Labs Review Labs Reviewed - No data to display  Imaging Review No results found.   Visual Acuity Review  Right Eye Distance:   Left Eye  Distance:   Bilateral Distance:    Right Eye Near:   Left Eye Near:    Bilateral Near:       hycet for cough  MDM   1. URI (upper respiratory infection)   2. Cough   3. Chest congestion     Patient is reassured that there are no issues that require transfer to higher level of care at this time or additional tests. Patient is advised to continue home symptomatic treatment. Patient is advised that if there are new or worsening symptoms to attend the emergency department, contact primary care provider, or return to UC. Instructions of care provided discharged home in stable condition. Return to work/school note provided.   THIS NOTE WAS GENERATED USING A VOICE RECOGNITION SOFTWARE PROGRAM. ALL REASONABLE EFFORTS  WERE MADE TO PROOFREAD THIS DOCUMENT FOR ACCURACY.  I have verbally reviewed the discharge instructions with the patient. A printed AVS was given to the patient.  All questions were answered prior to discharge.      Tharon Aquas, PA 07/13/15 1349

## 2015-12-21 ENCOUNTER — Emergency Department (HOSPITAL_COMMUNITY)
Admission: EM | Admit: 2015-12-21 | Discharge: 2015-12-21 | Disposition: A | Discharge status: Home / Self Care | Payer: Medicaid Other | Attending: Emergency Medicine | Admitting: Emergency Medicine

## 2015-12-21 ENCOUNTER — Encounter (HOSPITAL_COMMUNITY): Payer: Self-pay

## 2015-12-21 DIAGNOSIS — M5432 Sciatica, left side: Secondary | ICD-10-CM | POA: Diagnosis not present

## 2015-12-21 DIAGNOSIS — M79605 Pain in left leg: Secondary | ICD-10-CM | POA: Diagnosis present

## 2015-12-21 DIAGNOSIS — M5431 Sciatica, right side: Secondary | ICD-10-CM

## 2015-12-21 MED ORDER — LIDOCAINE 5 % EX PTCH: 1.0000 | MEDICATED_PATCH | CUTANEOUS | 0 refills | Status: DC

## 2015-12-21 MED ORDER — METHOCARBAMOL 500 MG PO TABS: 500.0000 mg | ORAL_TABLET | Freq: Two times a day (BID) | ORAL | 0 refills | Status: DC

## 2015-12-21 MED ORDER — NAPROXEN 500 MG PO TABS: 500.0000 mg | ORAL_TABLET | Freq: Two times a day (BID) | ORAL | 0 refills | Status: DC

## 2015-12-21 MED ORDER — PREDNISONE 10 MG PO TABS: 20.0000 mg | ORAL_TABLET | Freq: Two times a day (BID) | ORAL | 0 refills | Status: DC

## 2015-12-21 NOTE — ED Triage Notes (Signed)
Pt. Reports having rt. Buttock, hip and leg pain,.  Pt. Denies any injury.  The pain is constant with intermittent spasms

## 2015-12-21 NOTE — Discharge Instructions (Signed)
Take it easy, but do not lay around too much as this may make the stiffness worse. Take 500 mg of naproxen every 12 hours or 800 mg of ibuprofen every 8 hours for the next 3 days. Take these medications with food to avoid upset stomach. Robaxin is a muscle relaxer and may help loosen stiff muscles. Do not take the Robaxin while driving or performing other dangerous activities.  Follow-up with orthopedics should symptoms fail to resolve or for recurrent symptoms.

## 2015-12-21 NOTE — ED Provider Notes (Signed)
[facility]-EMERGENCY DEPT Provider Note   CSN: [id number] Arrival date & time: 12/21/15  [id number]  By signing my name below, I, Placido SouLogan Joldersma, attest that this documentation has been prepared under the direction and in the presence of Kayce Betty C. Guillaume Weninger, PA-C. Electronically Signed: Placido SouLogan Joldersma, ED Scribe. 12/21/15. 10:23 AM.   History   Chief Complaint Chief Complaint  Patient presents with  . Leg Pain    HPI HPI Comments: Renee [name] is a 40 y.o. female who presents to the Emergency Department complaining of waxing and waning, moderate, worsening, atraumatic, right buttock pain x 1 week. She states her pain began intermittently and worsened to a constant pain which radiates down her right posterior thigh to her right knee. Her pain worsens with palpation and movement. Patient states she has a history of this kind of pain, but has never been diagnosed with sciatica. She denies numbness, tingling, bowel or urinary issues, fever/chills, or any other complaints.   The history is provided by the patient. No language interpreter was used.    Past Medical History:  Diagnosis Date  . Breast feeding status of mother    currently breast feeding    Patient Active Problem List   Diagnosis Date Noted  . Dental cavities 07/21/2013  . Pain, dental 07/21/2013  . Anemia due to acute blood loss 01/06/2011  . Chorioamnionitis 01/03/2011  . Arrested active phase of labor 01/03/2011  . Cesarean delivery, without mention of indication, delivered, with or without mention of antepartum condition 01/03/2011  . Post-term pregnancy, 40-42 weeks of gestation 01/02/2011    Past Surgical History:  Procedure Laterality Date  . CESAREAN SECTION  01/03/2011   Procedure: CESAREAN SECTION;  Surgeon: Roseanna RainbowLisa A Jackson-Moore, MD;  Location: [facility];  Service: Gynecology;  Laterality: N/A;  . CHOLECYSTECTOMY  01/2008    OB History    Gravida Para Term Preterm AB Living   3 2 2   1 2    SAB TAB Ectopic Multiple Live  Births   1       2       Home Medications    Prior to Admission medications   Medication Sig Start Date End Date Taking? Authorizing Provider  acetaminophen (TYLENOL) 325 MG tablet Take 650 mg by mouth every 6 (six) hours as needed.    Historical Provider, MD  amoxicillin-clavulanate (AUGMENTIN) 875-125 MG per tablet Take 1 tablet by mouth 2 (two) times daily. Patient not taking: Reported on 11/17/2014 09/17/13   Doris Cheadleeepak Advani, MD  azithromycin (ZITHROMAX Z-PAK) 250 MG tablet Take 1 tablet (250 mg total) by mouth daily. Patient not taking: Reported on 01/10/2015 11/17/14   Rolland PorterMark James, MD  benzonatate (TESSALON) 100 MG capsule Take 1 capsule (100 mg total) by mouth every 8 (eight) hours. Patient not taking: Reported on 01/10/2015 11/17/14   Rolland PorterMark James, MD  [facility] [address] vaginally once.    Historical Provider, MD  fluconazole (DIFLUCAN) 150 MG tablet Take 150 mg by mouth daily.    Historical Provider, MD  fluticasone (FLONASE) 50 MCG/ACT nasal spray Place 2 sprays into both nostrils daily. 07/13/15   Tharon AquasFrank C Patrick, PA  HYDROcodone-acetaminophen (HYCET) 7.5-325 mg/15 ml solution 10 MLS 4 TIMES A DAY FOR COUGH 07/13/15   Tharon AquasFrank C Patrick, PA  lidocaine (LIDODERM) 5 % Place 1 patch onto the skin daily. Remove & Discard patch within 12 hours or as directed by MD 12/21/15   Anselm PancoastShawn C Rockelle Heuerman, PA-C  methocarbamol (ROBAXIN) 500 MG tablet Take  1 tablet (500 mg total) by mouth 2 (two) times daily. 12/21/15   Arby Dahir C Mcdaniel Ohms, PA-C  naproxen (NAPROSYN) 500 MG tablet Take 1 tablet (500 mg total) by mouth 2 (two) times daily as needed for mild pain or moderate pain. Patient not taking: Reported on 11/17/2014 07/21/13   Doris Cheadle, MD  naproxen (NAPROSYN) 500 MG tablet Take 1 tablet (500 mg total) by mouth 2 (two) times daily. 12/21/15   Delfin Squillace C Lynton Crescenzo, PA-C  nystatin cream (MYCOSTATIN) Apply 1 application topically 3 (three) times daily.    Historical Provider, MD  penicillin v potassium (VEETID) 250 MG tablet Take 2 tabs  TID for 10 days Patient not taking: Reported on 11/17/2014 03/27/13   Roma Kayser Schorr, NP  predniSONE (DELTASONE) 10 MG tablet Take 2 tablets (20 mg total) by mouth 2 (two) times daily with a meal. 12/21/15   August Gosser C Glinda Natzke, PA-C  Vitamin D, Ergocalciferol, (DRISDOL) 50000 UNITS CAPS capsule Take 1 capsule (50,000 Units total) by mouth every 7 (seven) days. Patient not taking: Reported on 11/17/2014 07/22/13   Doris Cheadle, MD    Family History No family history on file.  Social History Social History  Substance Use Topics  . Smoking status: Never Smoker  . Smokeless tobacco: Never Used  . Alcohol use No     Allergies   Review of patient's allergies indicates no known allergies.   Review of Systems Review of Systems  Constitutional: Negative for chills and fever.  Gastrointestinal: Negative for constipation and diarrhea.  Genitourinary: Negative for difficulty urinating and dysuria.  Musculoskeletal: Positive for arthralgias and myalgias.  Skin: Negative for color change.  Neurological: Negative for numbness.  All other systems reviewed and are negative.  Physical Exam Updated Vital Signs BP 124/88 (BP Location: Right Arm)   Pulse 72   Temp 98.1 F (36.7 C) (Oral)   Resp 18   LMP 12/04/2015   SpO2 97%   Physical Exam  Constitutional: She appears well-developed and well-nourished. No distress.  HENT:  Head: Normocephalic and atraumatic.  Eyes: Conjunctivae are normal.  Neck: Neck supple.  Cardiovascular: Normal rate, regular rhythm and intact distal pulses.   Pulses:      Dorsalis pedis pulses are 2+ on the right side.       Posterior tibial pulses are 2+ on the right side.  Pulmonary/Chest: Effort normal. No respiratory distress.  Abdominal: Soft. There is no tenderness. There is no guarding.  Musculoskeletal: She exhibits tenderness. She exhibits no edema.  Tenderness to the right sacral musculature. Motor intact bilaterally in the lower extremities. No midline  spinal tenderness.  Lymphadenopathy:    She has no cervical adenopathy.  Neurological: She is alert. She has normal reflexes.  No sensory deficits. Strength 5/5 in all extremities. No gait disturbance. Coordination intact.  Skin: Skin is warm and dry. She is not diaphoretic.  Psychiatric: She has a normal mood and affect. Her behavior is normal.  Nursing note and vitals reviewed.  ED Treatments / Results  Labs (all labs ordered are listed, but only abnormal results are displayed) Labs Reviewed - No data to display  EKG  EKG Interpretation None       Radiology No results found.  Procedures Procedures  DIAGNOSTIC STUDIES: Oxygen Saturation is 97% on RA, normal by my interpretation.    COORDINATION OF CARE: 10:22 AM Discussed next steps with pt. Pt verbalized understanding and is agreeable with the plan.    Medications Ordered in ED Medications -  No data to display   Initial Impression / Assessment and Plan / ED Course  I have reviewed the triage vital signs and the nursing notes.  Pertinent labs & imaging results that were available during my care of the patient were reviewed by me and considered in my medical decision making (see chart for details).  Clinical Course    Patient with symptoms of sciatica. No traumatic injury reported. No indication for imaging at this time. Patient to follow-up with orthopedics for continued symptoms. The patient was given instructions for home care as well as return precautions. Patient voices understanding of these instructions, accepts the plan, and is comfortable with discharge.   Final Clinical Impressions(s) / ED Diagnoses   Final diagnoses:  Sciatica of right side    New Prescriptions Discharge Medication List as of 12/21/2015 10:27 AM    START taking these medications   Details  lidocaine (LIDODERM) 5 % Place 1 patch onto the skin daily. Remove & Discard patch within 12 hours or as directed by MD, Starting Thu 12/21/2015,  Print    methocarbamol (ROBAXIN) 500 MG tablet Take 1 tablet (500 mg total) by mouth 2 (two) times daily., Starting Thu 12/21/2015, Print    !! naproxen (NAPROSYN) 500 MG tablet Take 1 tablet (500 mg total) by mouth 2 (two) times daily., Starting Thu 12/21/2015, Print    predniSONE (DELTASONE) 10 MG tablet Take 2 tablets (20 mg total) by mouth 2 (two) times daily with a meal., Starting Thu 12/21/2015, Print     !! - Potential duplicate medications found. Please discuss with provider.       Anselm Pancoast, PA-C 12/21/15 1053    Vanetta Mulders, MD 12/23/15 [id number]

## 2016-04-15 NOTE — L&D Delivery Note (Signed)
Delivery Note At 3:12 PM a viable female was delivered via VBAC, Spontaneous (Presentation: cephalic; ROA).  APGAR: 6, 9; weight pending.   Placenta status: grossly normal.  Cord: three vessel.  Cord pH: pending  Appearance of fetal head was followed by nuchal cord x 1, which was reduced.  Anterior shoulder difficult to deliver, so McRoberts maneuver was initiated and suprapubic pressure was applied.  The posterior shoulder was delivered and fetus was maneuvered diagonally to the left to free the anterior shoulder.  Foot cord x 1 was found.  NICU team arrived, but neonatal heart rate was >100 bpm and baby was responsive with strong cry, so minimal resuscitation was required.  Baby was then returned to mother's chest after his condition was determined to be stable and reassuring.  Umbilical artery gas was collected and sent to lab. Anesthesia:  epidural Episiotomy: None Lacerations: 2nd degree Suture Repair: monocryl Est. Blood Loss (mL): 200  Mom to postpartum.  Baby to Couplet care / Skin to Skin.  Lennox Solders, MD 12/04/2016, 3:55 PM  I confirm that I have verified the information documented in the resident's note and that I have also personally reperformed the physical exam and all medical decision making activities.   I was gloved and present for entire delivery SVD without incident I did the shoulder delivery maneuvers.  Was able to reach posterior axilla but could not deliver that way.  Was able to deliver anterior shoulder with SP pressure.  Nuchal cord reduced easily, there was also a foot cord. Lacerations as listed above Repair of same supervised by me Aviva Signs, CNM

## 2016-05-14 ENCOUNTER — Encounter (HOSPITAL_COMMUNITY): Payer: Self-pay | Admitting: Emergency Medicine

## 2016-05-14 ENCOUNTER — Emergency Department (HOSPITAL_COMMUNITY)
Admission: EM | Admit: 2016-05-14 | Discharge: 2016-05-14 | Disposition: A | Discharge status: Home / Self Care | Payer: Medicaid Other | Attending: Emergency Medicine | Admitting: Emergency Medicine

## 2016-05-14 DIAGNOSIS — J111 Influenza due to unidentified influenza virus with other respiratory manifestations: Secondary | ICD-10-CM | POA: Diagnosis not present

## 2016-05-14 DIAGNOSIS — R05 Cough: Secondary | ICD-10-CM | POA: Diagnosis present

## 2016-05-14 LAB — RAPID STREP SCREEN (MED CTR MEBANE ONLY): Streptococcus, Group A Screen (Direct): NEGATIVE

## 2016-05-14 MED ORDER — OSELTAMIVIR PHOSPHATE 75 MG PO CAPS: 75.0000 mg | ORAL_CAPSULE | Freq: Two times a day (BID) | ORAL | 0 refills | Status: DC

## 2016-05-14 NOTE — Discharge Instructions (Signed)
Take tylenol if you have a fever.   Take tamiflu as prescribed.   Stay hydrated.   See your OB doctor  Return to ER if you have trouble breathing, vaginal bleeding, severe abdominal pain, fever > 101 for 3 days.

## 2016-05-14 NOTE — ED Provider Notes (Signed)
[facility] Provider Note   CSN: [id number] Arrival date & time: 05/14/16  0901     History   Chief Complaint Chief Complaint  Patient presents with  . Cough    HPI Anaissa Macfadden is a 41 y.o. female [redacted] weeks pregnant, here with cough, flu syndrome. Patient states that she is [redacted] weeks pregnant has ultrasound done yesterday at the [facility] office that was unremarkable. She has flulike syndrome for several days. He states that her son was recently diagnosed with the flu yesterday and was given Tamiflu. She has been coughing and having runny nose. Has subjective chills and myalgias but denies actual fever. Has not taken anything for her myalgias as she is not sure what to take since she is pregnant. She specifically denies any abdominal pain or vaginal discharge or bleeding.    The history is provided by the patient.    Past Medical History:  Diagnosis Date  . Breast feeding status of mother    currently breast feeding    Patient Active Problem List   Diagnosis Date Noted  . Dental cavities 07/21/2013  . Pain, dental 07/21/2013  . Anemia due to acute blood loss 01/06/2011  . Chorioamnionitis 01/03/2011  . Arrested active phase of labor 01/03/2011  . Cesarean delivery, without mention of indication, delivered, with or without mention of antepartum condition 01/03/2011  . Post-term pregnancy, 40-42 weeks of gestation 01/02/2011    Past Surgical History:  Procedure Laterality Date  . CESAREAN SECTION  01/03/2011   Procedure: CESAREAN SECTION;  Surgeon: Roseanna Rainbow, MD;  Location: [facility];  Service: Gynecology;  Laterality: N/A;  . CHOLECYSTECTOMY  01/2008    OB History    Gravida Para Term Preterm AB Living   4 2 2   1 2    SAB TAB Ectopic Multiple Live Births   1       2       Home Medications    Prior to Admission medications   Medication Sig Start Date End Date Taking? Authorizing Provider  acetaminophen (TYLENOL) 325 MG tablet Take 650 mg by mouth every 6  (six) hours as needed.    Historical Provider, MD  amoxicillin-clavulanate (AUGMENTIN) 875-125 MG per tablet Take 1 tablet by mouth 2 (two) times daily. Patient not taking: Reported on 11/17/2014 09/17/13   Doris Cheadle, MD  azithromycin (ZITHROMAX Z-PAK) 250 MG tablet Take 1 tablet (250 mg total) by mouth daily. Patient not taking: Reported on 01/10/2015 11/17/14   Rolland Porter, MD  benzonatate (TESSALON) 100 MG capsule Take 1 capsule (100 mg total) by mouth every 8 (eight) hours. Patient not taking: Reported on 01/10/2015 11/17/14   Rolland Porter, MD  [facility] [address] vaginally once.    Historical Provider, MD  fluconazole (DIFLUCAN) 150 MG tablet Take 150 mg by mouth daily.    Historical Provider, MD  fluticasone (FLONASE) 50 MCG/ACT nasal spray Place 2 sprays into both nostrils daily. 07/13/15   Tharon Aquas, PA  HYDROcodone-acetaminophen (HYCET) 7.5-325 mg/15 ml solution 10 MLS 4 TIMES A DAY FOR COUGH 07/13/15   Tharon Aquas, PA  lidocaine (LIDODERM) 5 % Place 1 patch onto the skin daily. Remove & Discard patch within 12 hours or as directed by MD 12/21/15   Anselm Pancoast, PA-C  methocarbamol (ROBAXIN) 500 MG tablet Take 1 tablet (500 mg total) by mouth 2 (two) times daily. 12/21/15   Shawn C Joy, PA-C  naproxen (NAPROSYN) 500 MG tablet Take 1  tablet (500 mg total) by mouth 2 (two) times daily as needed for mild pain or moderate pain. Patient not taking: Reported on 11/17/2014 07/21/13   Doris Cheadleeepak Advani, MD  naproxen (NAPROSYN) 500 MG tablet Take 1 tablet (500 mg total) by mouth 2 (two) times daily. 12/21/15   Shawn C Joy, PA-C  nystatin cream (MYCOSTATIN) Apply 1 application topically 3 (three) times daily.    Historical Provider, MD  penicillin v potassium (VEETID) 250 MG tablet Take 2 tabs TID for 10 days Patient not taking: Reported on 11/17/2014 03/27/13   Roma KayserKatherine P Schorr, NP  predniSONE (DELTASONE) 10 MG tablet Take 2 tablets (20 mg total) by mouth 2 (two) times daily with a meal. 12/21/15   Shawn C  Joy, PA-C  Vitamin D, Ergocalciferol, (DRISDOL) 50000 UNITS CAPS capsule Take 1 capsule (50,000 Units total) by mouth every 7 (seven) days. Patient not taking: Reported on 11/17/2014 07/22/13   Doris Cheadleeepak Advani, MD    Family History No family history on file.  Social History Social History  Substance Use Topics  . Smoking status: Never Smoker  . Smokeless tobacco: Never Used  . Alcohol use No     Allergies   Patient has no known allergies.   Review of Systems Review of Systems  Respiratory: Positive for cough.   All other systems reviewed and are negative.    Physical Exam Updated Vital Signs BP 127/64   Pulse 87   Temp 98.7 F (37.1 C)   Resp 18   LMP 12/04/2015   SpO2 99%   Physical Exam  Constitutional: She is oriented to person, place, and time. She appears well-developed and well-nourished.  HENT:  Head: Normocephalic.  Right Ear: External ear normal.  Left Ear: External ear normal.  Mouth/Throat: Oropharynx is clear and moist.  Eyes: EOM are normal. Pupils are equal, round, and reactive to light.  Neck: Normal range of motion. Neck supple.  Cardiovascular: Normal rate, regular rhythm and normal heart sounds.   Pulmonary/Chest: Effort normal and breath sounds normal. No respiratory distress. She has no wheezes. She has no rales.  Abdominal: Soft. Bowel sounds are normal.  Gravid uterus, nontender   Musculoskeletal: Normal range of motion.  Neurological: She is alert and oriented to person, place, and time. No cranial nerve deficit. Coordination normal.  Skin: Skin is warm.  Psychiatric: She has a normal mood and affect.  Nursing note and vitals reviewed.    ED Treatments / Results  Labs (all labs ordered are listed, but only abnormal results are displayed) Labs Reviewed - No data to display  EKG  EKG Interpretation None       Radiology No results found.  Procedures Procedures (including critical care time)  Medications Ordered in  ED Medications - No data to display   Initial Impression / Assessment and Plan / ED Course  I have reviewed the triage vital signs and the nursing notes.  Pertinent labs & imaging results that were available during my care of the patient were reviewed by me and considered in my medical decision making (see chart for details).     Gwendel Hansonadia Collyer is a 41 y.o. female here with cough, myalgias. Son is recently diagnosed with flu. She is [redacted] weeks pregnant and had confirmed IUP yesterday and has no vaginal bleeding. Vitals stable, afebrile, not hypoxic. Given flu exposure and patient is pregnant, will start tamiflu empirically. Gave strict return precautions.    Final Clinical Impressions(s) / ED Diagnoses   Final diagnoses:  None    New Prescriptions New Prescriptions   No medications on file     Charlynne Pander, MD 05/14/16 1004

## 2016-05-14 NOTE — ED Notes (Signed)
Patient called multiple times by triage RN and myself with no response

## 2016-05-14 NOTE — ED Notes (Signed)
Pt states shes seeing a MD for her pregnancy, states she had US done yesterday which shows no abnormalities

## 2016-05-14 NOTE — ED Notes (Signed)
Peds MD will see patient with her son on pediatric side

## 2016-05-14 NOTE — ED Triage Notes (Signed)
Pt states "my son had the flu and now Im coughing and have stuffy nose". Pt states she is [redacted] weeks pregnant.

## 2016-05-16 LAB — CULTURE, GROUP A STREP (THRC)

## 2016-05-22 ENCOUNTER — Inpatient Hospital Stay (HOSPITAL_COMMUNITY)
Admission: AD | Admit: 2016-05-22 | Discharge: 2016-05-22 | Disposition: A | Discharge status: Home / Self Care | Payer: Medicaid Other | Source: Ambulatory Visit | Attending: Obstetrics & Gynecology | Admitting: Obstetrics & Gynecology

## 2016-05-22 ENCOUNTER — Encounter (HOSPITAL_COMMUNITY): Payer: Self-pay | Admitting: *Deleted

## 2016-05-22 DIAGNOSIS — N760 Acute vaginitis: Secondary | ICD-10-CM | POA: Diagnosis not present

## 2016-05-22 DIAGNOSIS — B373 Candidiasis of vulva and vagina: Secondary | ICD-10-CM | POA: Insufficient documentation

## 2016-05-22 DIAGNOSIS — B9689 Other specified bacterial agents as the cause of diseases classified elsewhere: Secondary | ICD-10-CM

## 2016-05-22 DIAGNOSIS — O98811 Other maternal infectious and parasitic diseases complicating pregnancy, first trimester: Secondary | ICD-10-CM | POA: Insufficient documentation

## 2016-05-22 DIAGNOSIS — Z3A12 12 weeks gestation of pregnancy: Secondary | ICD-10-CM | POA: Diagnosis not present

## 2016-05-22 DIAGNOSIS — B3731 Acute candidiasis of vulva and vagina: Secondary | ICD-10-CM

## 2016-05-22 LAB — URINALYSIS, ROUTINE W REFLEX MICROSCOPIC
Bilirubin Urine: NEGATIVE
Glucose, UA: NEGATIVE mg/dL
Hgb urine dipstick: NEGATIVE
KETONES UR: NEGATIVE mg/dL
Nitrite: NEGATIVE
PH: 6 (ref 5.0–8.0)
Protein, ur: NEGATIVE mg/dL
SPECIFIC GRAVITY, URINE: 1.011 (ref 1.005–1.030)

## 2016-05-22 LAB — WET PREP, GENITAL
SPERM: NONE SEEN
TRICH WET PREP: NONE SEEN

## 2016-05-22 MED ORDER — TERCONAZOLE 0.4 % VA CREA
1.0000 | TOPICAL_CREAM | Freq: Every day | VAGINAL | 0 refills | Status: DC
Start: 2016-05-22 — End: 2016-06-21

## 2016-05-22 MED ORDER — NYSTATIN-TRIAMCINOLONE 100000-0.1 UNIT/GM-% EX CREA: TOPICAL_CREAM | CUTANEOUS | 0 refills | Status: DC

## 2016-05-22 MED ORDER — METRONIDAZOLE 500 MG PO TABS: 500.0000 mg | ORAL_TABLET | Freq: Two times a day (BID) | ORAL | 0 refills | Status: DC

## 2016-05-22 NOTE — MAU Note (Signed)
Vaginal itching started a few days ago.  Washed with baking soda and water, stopped for a day, but came back worse.

## 2016-05-22 NOTE — Discharge Instructions (Signed)
Bacterial Vaginosis Bacterial vaginosis is an infection of the vagina. It happens when too many germs (bacteria) grow in the vagina. This infection puts you at risk for infections from sex (STIs). Treating this infection can lower your risk for some STIs. You should also treat this if you are pregnant. It can cause your baby to be born early. Follow these instructions at home: Medicines   Take over-the-counter and prescription medicines only as told by your doctor.  Take or use your antibiotic medicine as told by your doctor. Do not stop taking or using it even if you start to feel better. General instructions   If you your sexual partner is a woman, tell her that you have this infection. She needs to get treatment if she has symptoms. If you have a female partner, he does not need to be treated.  During treatment:  Avoid sex.  Do not douche.  Avoid alcohol as told.  Avoid breastfeeding as told.  Drink enough fluid to keep your pee (urine) clear or pale yellow.  Keep your vagina and butt (rectum) clean.  Wash the area with warm water every day.  Wipe from front to back after you use the toilet.  Keep all follow-up visits as told by your doctor. This is important. Preventing this condition   Do not douche.  Use only warm water to wash around your vagina.  Use protection when you have sex. This includes:  Latex condoms.  Dental dams.  Limit how many people you have sex with. It is best to only have sex with the same person (be monogamous).  Get tested for STIs. Have your partner get tested.  Wear underwear that is cotton or lined with cotton.  Avoid tight pants and pantyhose. This is most important in summer.  Do not use any products that have nicotine or tobacco in them. These include cigarettes and e-cigarettes. If you need help quitting, ask your doctor.  Do not use illegal drugs.  Limit how much alcohol you drink. Contact a doctor if:  Your symptoms do not  get better, even after you are treated.  You have more discharge or pain when you pee (urinate).  You have a fever.  You have pain in your belly (abdomen).  You have pain with sex.  Your bleed from your vagina between periods. Summary  This infection happens when too many germs (bacteria) grow in the vagina.  Treating this condition can lower your risk for some infections from sex (STIs).  You should also treat this if you are pregnant. It can cause early (premature) birth.  Do not stop taking or using your antibiotic medicine even if you start to feel better. This information is not intended to replace advice given to you by your health care provider. Make sure you discuss any questions you have with your health care provider. Document Released: 01/09/2008 Document Revised: 12/16/2015 Document Reviewed: 12/16/2015 Elsevier Interactive Patient Education  2017 [employer] Vaginal Yeast infection, Adult Vaginal yeast infection is a condition that causes soreness, swelling, and redness (inflammation) of the vagina. It also causes vaginal discharge. This is a common condition. Some women get this infection frequently. What are the causes? This condition is caused by a change in the normal balance of the yeast (candida) and bacteria that live in the vagina. This change causes an overgrowth of yeast, which causes the inflammation. What increases the risk? This condition is more likely to develop in:  Women who take antibiotic medicines.    Women who have diabetes.  Women who take birth control pills.  Women who are pregnant.  Women who douche often.  Women who have a weak defense (immune) system.  Women who have been taking steroid medicines for a long time.  Women who frequently wear tight clothing. What are the signs or symptoms? Symptoms of this condition include:  White, thick vaginal discharge.  Swelling, itching, redness, and irritation of the vagina. The lips of  the vagina (vulva) may be affected as well.  Pain or a burning feeling while urinating.  Pain during sex. How is this diagnosed? This condition is diagnosed with a medical history and physical exam. This will include a pelvic exam. Your health care provider will examine a sample of your vaginal discharge under a microscope. Your health care provider may send this sample for testing to confirm the diagnosis. How is this treated? This condition is treated with medicine. Medicines may be over-the-counter or prescription. You may be told to use one or more of the following:  Medicine that is taken orally.  Medicine that is applied as a cream.  Medicine that is inserted directly into the vagina (suppository). Follow these instructions at home:  Take or apply over-the-counter and prescription medicines only as told by your health care provider.  Do not have sex until your health care provider has approved. Tell your sex partner that you have a yeast infection. That person should go to his or her health care provider if he or she develops symptoms.  Do not wear tight clothes, such as pantyhose or tight pants.  Avoid using tampons until your health care provider approves.  Eat more yogurt. This may help to keep your yeast infection from returning.  Try taking a sitz bath to help with discomfort. This is a warm water bath that is taken while you are sitting down. The water should only come up to your hips and should cover your buttocks. Do this 3-4 times per day or as told by your health care provider.  Do not douche.  Wear breathable, cotton underwear.  If you have diabetes, keep your blood sugar levels under control. Contact a health care provider if:  You have a fever.  Your symptoms go away and then return.  Your symptoms do not get better with treatment.  Your symptoms get worse.  You have new symptoms.  You develop blisters in or around your vagina.  You have blood coming  from your vagina and it is not your menstrual period.  You develop pain in your abdomen. This information is not intended to replace advice given to you by your health care provider. Make sure you discuss any questions you have with your health care provider. Document Released: 01/09/2005 Document Revised: 09/13/2015 Document Reviewed: 10/03/2014 Elsevier Interactive Patient Education  2017 [employer]  

## 2016-05-22 NOTE — MAU Provider Note (Signed)
History     CSN: [id number]  Arrival date and time: 05/22/16 1525   First Provider Initiated Contact with Patient 05/22/16 1608      Chief Complaint  Patient presents with  . Vaginal Itching   HPI    [name] is a 41 y.o. female [phone] @ 5551w2d here in MAU with a four day history of vaginal itching. She has not started prenatal care and had no other place to go for this problem. She tried using baking soda over the counter and that helped for one day and the symptoms return. The itching is all over her vagina.   OB History    Gravida Para Term Preterm AB Living   4 2 2   1 2    SAB TAB Ectopic Multiple Live Births   1       2      Past Medical History:  Diagnosis Date  . Breast feeding status of mother    currently breast feeding    Past Surgical History:  Procedure Laterality Date  . CESAREAN SECTION  01/03/2011   Procedure: CESAREAN SECTION;  Surgeon: Roseanna RainbowLisa A Jackson-Moore, MD;  Location: [facility];  Service: Gynecology;  Laterality: N/A;  . CHOLECYSTECTOMY  01/2008    No family history on file.  Social History  Substance Use Topics  . Smoking status: Never Smoker  . Smokeless tobacco: Never Used  . Alcohol use No    Allergies: No Known Allergies  Prescriptions Prior to Admission  Medication Sig Dispense Refill Last Dose  . oseltamivir (TAMIFLU) 75 MG capsule Take 1 capsule (75 mg total) by mouth every 12 (twelve) hours. 10 capsule 0 05/22/2016 at Unknown time  . HYDROcodone-acetaminophen (HYCET) 7.5-325 mg/15 ml solution 10 MLS 4 TIMES A DAY FOR COUGH (Patient not taking: Reported on 05/22/2016) 120 mL 0 Not Taking at Unknown time  . lidocaine (LIDODERM) 5 % Place 1 patch onto the skin daily. Remove & Discard patch within 12 hours or as directed by MD (Patient not taking: Reported on 05/22/2016) 30 patch 0 Not Taking at Unknown time  . methocarbamol (ROBAXIN) 500 MG tablet Take 1 tablet (500 mg total) by mouth 2 (two) times daily. (Patient not taking: Reported on  05/22/2016) 20 tablet 0 Not Taking at Unknown time  . naproxen (NAPROSYN) 500 MG tablet Take 1 tablet (500 mg total) by mouth 2 (two) times daily. (Patient not taking: Reported on 05/22/2016) 30 tablet 0 Not Taking at Unknown time  . predniSONE (DELTASONE) 10 MG tablet Take 2 tablets (20 mg total) by mouth 2 (two) times daily with a meal. (Patient not taking: Reported on 05/22/2016) 20 tablet 0 Completed Course at Unknown time   Results for orders placed or performed during the hospital encounter of 05/22/16 (from the past 72 hour(s))  Urinalysis, Routine w reflex microscopic     Status: Abnormal   Collection Time: 05/22/16  3:35 PM  Result Value Ref Range   Color, Urine YELLOW YELLOW   APPearance CLEAR CLEAR   Specific Gravity, Urine 1.011 1.005 - 1.030   pH 6.0 5.0 - 8.0   Glucose, UA NEGATIVE NEGATIVE mg/dL   Hgb urine dipstick NEGATIVE NEGATIVE   Bilirubin Urine NEGATIVE NEGATIVE   Ketones, ur NEGATIVE NEGATIVE mg/dL   Protein, ur NEGATIVE NEGATIVE mg/dL   Nitrite NEGATIVE NEGATIVE   Leukocytes, UA SMALL (A) NEGATIVE   RBC / HPF 0-5 0 - 5 RBC/hpf   WBC, UA 0-5 0 - 5 WBC/hpf  Bacteria, UA RARE (A) NONE SEEN   Squamous Epithelial / LPF 0-5 (A) NONE SEEN  Wet prep, genital     Status: Abnormal   Collection Time: 05/22/16  4:12 PM  Result Value Ref Range   Yeast Wet Prep HPF POC PRESENT (A) NONE SEEN   Trich, Wet Prep NONE SEEN NONE SEEN   Clue Cells Wet Prep HPF POC PRESENT (A) NONE SEEN   WBC, Wet Prep HPF POC MODERATE (A) NONE SEEN    Comment: MANY BACTERIA SEEN   Sperm NONE SEEN     Review of Systems  Constitutional: Negative for chills and fever.  Gastrointestinal: Negative for abdominal pain.  Genitourinary: Negative for dysuria.   Physical Exam   Blood pressure 118/74, pulse 77, temperature 97.3 F (36.3 C), temperature source Oral, resp. rate 18, weight 187 lb 4 oz (84.9 kg), last menstrual period 12/04/2015.  Physical Exam  Constitutional: She is oriented to person,  place, and time. She appears well-developed and well-nourished. No distress.  HENT:  Head: Normocephalic.  Eyes: Pupils are equal, round, and reactive to light.  Genitourinary:  Genitourinary Comments: Wet prep and GC collected without speculum   Musculoskeletal: Normal range of motion.  Neurological: She is alert and oriented to person, place, and time.  Skin: Skin is warm. She is not diaphoretic.  Psychiatric: Her behavior is normal.    MAU Course  Procedures  None  MDM  Wet prep & GC + fetal heart tones via doppler    Assessment and Plan   A:  1. Yeast vaginitis   2. BV (bacterial vaginosis)     P:  Discharge home in stable condition  Rx: Terazol, mycolog, Flagyl Keep OB appointment Return to MAU if symptoms worsen  Duane Lope, NP 05/22/2016  5:23 PM

## 2016-05-23 LAB — GC/CHLAMYDIA PROBE AMP (~~LOC~~) NOT AT ARMC
CHLAMYDIA, DNA PROBE: NEGATIVE
Neisseria Gonorrhea: NEGATIVE

## 2016-06-20 DIAGNOSIS — O0990 Supervision of high risk pregnancy, unspecified, unspecified trimester: Secondary | ICD-10-CM | POA: Insufficient documentation

## 2016-06-21 ENCOUNTER — Other Ambulatory Visit: Payer: Self-pay

## 2016-06-21 ENCOUNTER — Ambulatory Visit (INDEPENDENT_AMBULATORY_CARE_PROVIDER_SITE_OTHER): Payer: Medicaid Other | Admitting: Certified Nurse Midwife

## 2016-06-21 ENCOUNTER — Other Ambulatory Visit (HOSPITAL_COMMUNITY)
Admission: RE | Admit: 2016-06-21 | Discharge: 2016-06-21 | Disposition: A | Discharge status: Home / Self Care | Payer: Medicaid Other | Source: Ambulatory Visit | Attending: Certified Nurse Midwife | Admitting: Certified Nurse Midwife

## 2016-06-21 ENCOUNTER — Encounter: Payer: Self-pay | Admitting: Certified Nurse Midwife

## 2016-06-21 DIAGNOSIS — O09522 Supervision of elderly multigravida, second trimester: Secondary | ICD-10-CM

## 2016-06-21 DIAGNOSIS — Z98891 History of uterine scar from previous surgery: Secondary | ICD-10-CM

## 2016-06-21 DIAGNOSIS — Z1151 Encounter for screening for human papillomavirus (HPV): Secondary | ICD-10-CM | POA: Diagnosis present

## 2016-06-21 DIAGNOSIS — O34219 Maternal care for unspecified type scar from previous cesarean delivery: Secondary | ICD-10-CM

## 2016-06-21 DIAGNOSIS — O0932 Supervision of pregnancy with insufficient antenatal care, second trimester: Secondary | ICD-10-CM | POA: Diagnosis not present

## 2016-06-21 DIAGNOSIS — Z3401 Encounter for supervision of normal first pregnancy, first trimester: Secondary | ICD-10-CM | POA: Diagnosis not present

## 2016-06-21 DIAGNOSIS — Z113 Encounter for screening for infections with a predominantly sexual mode of transmission: Secondary | ICD-10-CM | POA: Diagnosis present

## 2016-06-21 DIAGNOSIS — O0990 Supervision of high risk pregnancy, unspecified, unspecified trimester: Secondary | ICD-10-CM

## 2016-06-21 DIAGNOSIS — Z01419 Encounter for gynecological examination (general) (routine) without abnormal findings: Secondary | ICD-10-CM | POA: Diagnosis present

## 2016-06-21 DIAGNOSIS — O09529 Supervision of elderly multigravida, unspecified trimester: Secondary | ICD-10-CM | POA: Insufficient documentation

## 2016-06-21 NOTE — Progress Notes (Signed)
Patient has headache- ? Exposure to strep throat.

## 2016-06-21 NOTE — Progress Notes (Signed)
Subjective:    Renee [name] is being seen today for her first obstetrical visit.  This is a planned pregnancy. She is at 3267w4d gestation. Her obstetrical history is significant for advanced maternal age, obesity and previous C-section X1 with prior vaginal delivery. Relationship with FOB: spouse, living together. Patient does intend to breast feed. Pregnancy history fully reviewed.  The information documented in the HPI was reviewed and verified.  Menstrual History: OB History    Gravida Para Term Preterm AB Living   4 2 2   1 2    SAB TAB Ectopic Multiple Live Births   1       2       Patient's last menstrual period was 12/04/2015.    Past Medical History:  Diagnosis Date  . Breast feeding status of mother    currently breast feeding    Past Surgical History:  Procedure Laterality Date  . CESAREAN SECTION  01/03/2011   Procedure: CESAREAN SECTION;  Surgeon: Roseanna RainbowLisa A Jackson-Moore, MD;  Location: [facility];  Service: Gynecology;  Laterality: N/A;  . CHOLECYSTECTOMY  01/2008     (Not in a hospital admission) No Known Allergies  Social History  Substance Use Topics  . Smoking status: Never Smoker  . Smokeless tobacco: Never Used  . Alcohol use No    History reviewed. No pertinent family history.   Review of Systems Constitutional: negative for weight loss Gastrointestinal: negative for vomiting Genitourinary:negative for genital lesions and vaginal discharge and dysuria Musculoskeletal:negative for back pain Behavioral/Psych: negative for abusive relationship, depression, illegal drug usage and tobacco use    Objective:    BP 119/77   Pulse 82   Wt 186 lb 3.2 oz (84.5 kg)   LMP 12/04/2015   BMI 32.98 kg/m  General Appearance:    Alert, cooperative, no distress, appears stated age  Head:    Normocephalic, without obvious abnormality, atraumatic  Eyes:    PERRL, conjunctiva/corneas clear, EOM's intact, fundi    benign, both eyes  Ears:    Normal TM's and external ear  canals, both ears  Nose:   Nares normal, septum midline, mucosa normal, no drainage    or sinus tenderness  Throat:   Lips, mucosa, and tongue normal; teeth and gums normal  Neck:   Supple, symmetrical, trachea midline, no adenopathy;    thyroid:  no enlargement/tenderness/nodules; no carotid   bruit or JVD  Back:     Symmetric, no curvature, ROM normal, no CVA tenderness  Lungs:     Clear to auscultation bilaterally, respirations unlabored  Chest Wall:    No tenderness or deformity   Heart:    Regular rate and rhythm, S1 and S2 normal, no murmur, rub   or gallop  Breast Exam:    No tenderness, masses, or nipple abnormality  Abdomen:     Soft, non-tender, bowel sounds active all four quadrants,    no masses, no organomegaly  Genitalia:    Normal female without lesion, discharge or tenderness  Extremities:   Extremities normal, atraumatic, no cyanosis or edema  Pulses:   2+ and symmetric all extremities  Skin:   Skin color, texture, turgor normal, no rashes or lesions  Lymph nodes:   Cervical, supraclavicular, and axillary nodes normal  Neurologic:   CNII-XII intact, normal strength, sensation and reflexes    throughout        Cervix: long, thick, closed and posterior.  FHR: 158 by doppler. FH: @U .     Lab Review Urine  pregnancy test Labs reviewed yes Radiologic studies reviewed no Assessment:    Pregnancy at [redacted]w[redacted]d weeks   Supervision of high risk pregnancy, antepartum - Plan: Obstetric Panel, Including HIV, Cytology - PAP, AMB referral to maternal fetal medicine, AMB MFM GENETICS REFERRAL, [place] MFM OB DETAIL +14 WK, TSH, Hemoglobinopathy evaluation, Varicella zoster antibody, IgG, Culture, OB Urine, Hemoglobin A1c, Cystic Fibrosis Mutation 97, MaterniT Genome, AFP, Serum, Open Spina Bifida  H/O: C-section  Elderly multigravida in second trimester - Plan: Obstetric Panel, Including HIV, Cytology - PAP, AMB referral to maternal fetal medicine, AMB MFM GENETICS REFERRAL, [place] MFM OB DETAIL  +14 WK, TSH, Hemoglobinopathy evaluation, Varicella zoster antibody, IgG, Culture, OB Urine, Hemoglobin A1c, Cystic Fibrosis Mutation 97, MaterniT Genome, AFP, Serum, Open Spina Bifida  Late prenatal care affecting pregnancy in second trimester - Plan: Obstetric Panel, Including HIV, Cytology - PAP, AMB referral to maternal fetal medicine, AMB MFM GENETICS REFERRAL, [place] MFM OB DETAIL +14 WK, TSH, Hemoglobinopathy evaluation, Varicella zoster antibody, IgG, Culture, OB Urine, Hemoglobin A1c, Cystic Fibrosis Mutation 97, MaterniT Genome, AFP, Serum, Open Spina Bifida   Plan:     TOLAC: consent signed.  Prenatal vitamins.  Counseling provided regarding continued use of seat belts, cessation of alcohol consumption, smoking or use of illicit drugs; infection precautions i.e., influenza/TDAP immunizations, toxoplasmosis,CMV, parvovirus, listeria and varicella; workplace safety, exercise during pregnancy; routine dental care, safe medications, sexual activity, hot tubs, saunas, pools, travel, caffeine use, fish and methlymercury, potential toxins, hair treatments, varicose veins Weight gain recommendations per IOM guidelines reviewed: underweight/BMI< 18.5--> gain 28 - 40 lbs; normal weight/BMI 18.5 - 24.9--> gain 25 - 35 lbs; overweight/BMI 25 - 29.9--> gain 15 - 25 lbs; obese/BMI >30->gain  11 - 20 lbs Problem list reviewed and updated. FIRST/CF mutation testing/NIPT/QUAD SCREEN/fragile X/Ashkenazi Jewish population testing/Spinal muscular atrophy discussed: ordered. Role of ultrasound in pregnancy discussed; fetal survey: ordered. Amniocentesis discussed: not discussed. VBAC calculator score:62.5% VBAC consent form provided Meds ordered this encounter  Medications  . Prenatal Vit-Fe Fumarate-FA (MULTIVITAMIN-PRENATAL) 27-0.8 MG TABS tablet    Sig: Take 1 tablet by mouth daily at 12 noon.   Orders Placed This Encounter  Procedures  . Culture, OB Urine  . [place] MFM OB DETAIL +14 WK    Standing  Status:   Future    Standing Expiration Date:   08/21/2017    Order Specific Question:   Reason for Exam (SYMPTOM  OR DIAGNOSIS REQUIRED)    Answer:   fetal anatomy scan    Order Specific Question:   Preferred imaging location?    Answer:   [facility]  . Obstetric Panel, Including HIV  . TSH  . Hemoglobinopathy evaluation  . Varicella zoster antibody, IgG  . Hemoglobin A1c  . Cystic Fibrosis Mutation 97  . MaterniT Genome    Order Specific Question:   Is the patient insulin dependent?    Answer:   No    Order Specific Question:   Please enter gestational age. This should be expressed as weeks AND days, i.e. 16w 6d. Enter weeks here. Enter days in next question.    Answer:   44    Order Specific Question:   Please enter gestational age. This should be expressed as weeks AND days, i.e. 16w 6d. Enter days here. Enter weeks in previous question.    Answer:   4    Order Specific Question:   How was gestational age calculated?    Answer:   LMP    Order Specific  Question:   Please give the date of LMP OR Ultrasound OR Estimated date of delivery.    Answer:   12/02/2016    Order Specific Question:   Number of Fetuses (Type of Pregnancy):    Answer:   1    Order Specific Question:   Indications for performing the test? (please choose all that apply):    Answer:   Routine screening    Order Specific Question:   Other Indications? (Y=Yes, N=No)    Answer:   N    Order Specific Question:   If this is a repeat specimen, please indicate the reason:    Answer:   Not indicated    Order Specific Question:   Please specify the patient's race: (C=White/Caucasion, B=Black, I=Native American, A=Asian, H=Hispanic, O=Other, U=Unknown)    Answer:   O    Order Specific Question:   Donor Egg - indicate if the egg was obtained from in vitro fertilization.    Answer:   N    Order Specific Question:   Age of Egg Donor.    Answer:   42    Order Specific Question:   Prior Down Syndrome/ONTD screening during  current pregnancy.    Answer:   N    Order Specific Question:   Prior First Trimester Testing    Answer:   N    Order Specific Question:   Prior Second Trimester Testing    Answer:   N    Order Specific Question:   Family History of Neural Tube Defects    Answer:   N    Order Specific Question:   Prior Pregnancy with Down Syndrome    Answer:   N    Order Specific Question:   Please give the patient's weight (in pounds)    Answer:   186  . AFP, Serum, Open Spina Bifida    Order Specific Question:   Is patient insulin dependent?    Answer:   No    Order Specific Question:   Patient weight (lb.)    Answer:   186 lb 3.2 oz (84.5 kg)    Order Specific Question:   Gestational Age (GA), weeks    Answer:   13.4    Order Specific Question:   Date on which patient was at this GA    Answer:   06/21/2016    Order Specific Question:   GA Calculation Method    Answer:   LMP    Order Specific Question:   GA Date    Answer:   12/02/2016    Order Specific Question:   Number of fetuses    Answer:   1    Order Specific Question:   Donor egg?    Answer:   N    Order Specific Question:   Age of egg donor?    Answer:   68  . AMB referral to maternal fetal medicine    Referral Priority:   Routine    Referral Type:   Consultation    Referral Reason:   Specialty Services Required    Number of Visits Requested:   1  . AMB MFM GENETICS REFERRAL    Referral Priority:   Routine    Referral Type:   Consultation    Referral Reason:   Specialty Services Required    Number of Visits Requested:   1    Follow up in 4 weeks. 50% of 30 min visit spent on counseling and coordination of care.

## 2016-06-23 LAB — URINE CULTURE, OB REFLEX: ORGANISM ID, BACTERIA: NO GROWTH

## 2016-06-23 LAB — CULTURE, OB URINE

## 2016-06-24 ENCOUNTER — Other Ambulatory Visit: Payer: Self-pay | Admitting: *Deleted

## 2016-06-24 DIAGNOSIS — O0990 Supervision of high risk pregnancy, unspecified, unspecified trimester: Secondary | ICD-10-CM

## 2016-06-24 LAB — CYTOLOGY - PAP
Adequacy: ABSENT
DIAGNOSIS: NEGATIVE
HPV: NOT DETECTED

## 2016-06-25 ENCOUNTER — Other Ambulatory Visit: Payer: Self-pay | Admitting: Certified Nurse Midwife

## 2016-06-25 DIAGNOSIS — O0990 Supervision of high risk pregnancy, unspecified, unspecified trimester: Secondary | ICD-10-CM

## 2016-06-25 LAB — OBSTETRIC PANEL, INCLUDING HIV
ANTIBODY SCREEN: NEGATIVE
BASOS: 0 %
Basophils Absolute: 0 10*3/uL (ref 0.0–0.2)
EOS (ABSOLUTE): 0 10*3/uL (ref 0.0–0.4)
EOS: 0 %
HEMATOCRIT: 39.4 % (ref 34.0–46.6)
HEMOGLOBIN: 13.3 g/dL (ref 11.1–15.9)
HIV Screen 4th Generation wRfx: NONREACTIVE
Hepatitis B Surface Ag: NEGATIVE
IMMATURE GRANS (ABS): 0 10*3/uL (ref 0.0–0.1)
Immature Granulocytes: 0 %
LYMPHS: 18 %
Lymphocytes Absolute: 1.9 10*3/uL (ref 0.7–3.1)
MCH: 31.9 pg (ref 26.6–33.0)
MCHC: 33.8 g/dL (ref 31.5–35.7)
MCV: 95 fL (ref 79–97)
MONOCYTES: 7 %
MONOS ABS: 0.7 10*3/uL (ref 0.1–0.9)
NEUTROS PCT: 75 %
Neutrophils Absolute: 7.8 10*3/uL — ABNORMAL HIGH (ref 1.4–7.0)
Platelets: 176 10*3/uL (ref 150–379)
RBC: 4.17 x10E6/uL (ref 3.77–5.28)
RDW: 13.3 % (ref 12.3–15.4)
RH TYPE: POSITIVE
RPR Ser Ql: NONREACTIVE
RUBELLA: 24.5 {index} (ref >0.99)
WBC: 10.5 10*3/uL (ref 3.4–10.8)

## 2016-06-25 LAB — VARICELLA ZOSTER ANTIBODY, IGG: VARICELLA: 825 {index} (ref >165)

## 2016-06-25 LAB — CERVICOVAGINAL ANCILLARY ONLY
Bacterial vaginitis: NEGATIVE
Candida vaginitis: NEGATIVE

## 2016-06-25 LAB — HEMOGLOBINOPATHY EVALUATION
HEMOGLOBIN F QUANTITATION: 0 % (ref 0.0–2.0)
HGB C: 0 %
HGB S: 0 %
HGB VARIANT: 0 %
Hemoglobin A2 Quantitation: 2.6 % (ref 1.8–3.2)
Hgb A: 97.4 % (ref 96.4–98.8)

## 2016-06-25 LAB — TSH: TSH: 2.02 u[IU]/mL (ref 0.450–4.500)

## 2016-06-25 LAB — HEMOGLOBIN A1C
Est. average glucose Bld gHb Est-mCnc: 94 mg/dL
HEMOGLOBIN A1C: 4.9 % (ref 4.8–5.6)

## 2016-06-27 ENCOUNTER — Other Ambulatory Visit: Payer: Self-pay | Admitting: Certified Nurse Midwife

## 2016-06-27 LAB — AFP, SERUM, OPEN SPINA BIFIDA

## 2016-06-30 LAB — MATERNIT GENOME

## 2016-07-01 ENCOUNTER — Other Ambulatory Visit: Payer: Medicaid Other

## 2016-07-01 ENCOUNTER — Other Ambulatory Visit: Payer: Self-pay | Admitting: Certified Nurse Midwife

## 2016-07-01 DIAGNOSIS — O0990 Supervision of high risk pregnancy, unspecified, unspecified trimester: Secondary | ICD-10-CM

## 2016-07-01 DIAGNOSIS — Z3A18 18 weeks gestation of pregnancy: Secondary | ICD-10-CM

## 2016-07-02 ENCOUNTER — Encounter (HOSPITAL_COMMUNITY): Payer: Self-pay | Admitting: Certified Nurse Midwife

## 2016-07-03 ENCOUNTER — Other Ambulatory Visit: Payer: Self-pay | Admitting: Certified Nurse Midwife

## 2016-07-03 DIAGNOSIS — Z141 Cystic fibrosis carrier: Secondary | ICD-10-CM | POA: Insufficient documentation

## 2016-07-03 DIAGNOSIS — O0990 Supervision of high risk pregnancy, unspecified, unspecified trimester: Secondary | ICD-10-CM

## 2016-07-03 HISTORY — DX: Cystic fibrosis carrier: Z14.1

## 2016-07-03 LAB — CYSTIC FIBROSIS MUTATION 97

## 2016-07-05 ENCOUNTER — Other Ambulatory Visit: Payer: Self-pay | Admitting: Certified Nurse Midwife

## 2016-07-05 DIAGNOSIS — O0990 Supervision of high risk pregnancy, unspecified, unspecified trimester: Secondary | ICD-10-CM

## 2016-07-05 LAB — AFP, QUAD SCREEN
DIA Mom Value: 0.55
DIA Value (EIA): 84.83 pg/mL
DSR (By Age)    1 IN: 95
DSR (Second Trimester) 1 IN: 1941
GESTATIONAL AGE AFP: 18 wk
MSAFP MOM: 0.57
MSAFP: 21.4 ng/mL
MSHCG MOM: 0.58
MSHCG: 13967 m[IU]/mL
Maternal Age At EDD: 39.7 YEARS
OSB RISK: 10000
Test Results:: NEGATIVE
UE3 MOM: 0.77
UE3 VALUE: 0.96 ng/mL
Weight: 186 [lb_av]

## 2016-07-08 ENCOUNTER — Other Ambulatory Visit: Payer: Self-pay | Admitting: Certified Nurse Midwife

## 2016-07-09 ENCOUNTER — Encounter (HOSPITAL_COMMUNITY): Payer: Self-pay

## 2016-07-09 ENCOUNTER — Ambulatory Visit (HOSPITAL_COMMUNITY)
Admission: RE | Admit: 2016-07-09 | Discharge: 2016-07-09 | Disposition: A | Discharge status: Home / Self Care | Payer: Medicaid Other | Source: Ambulatory Visit | Attending: Certified Nurse Midwife | Admitting: Certified Nurse Midwife

## 2016-07-09 ENCOUNTER — Other Ambulatory Visit (HOSPITAL_COMMUNITY): Payer: Self-pay | Admitting: *Deleted

## 2016-07-09 ENCOUNTER — Other Ambulatory Visit: Payer: Self-pay | Admitting: Certified Nurse Midwife

## 2016-07-09 DIAGNOSIS — Z3689 Encounter for other specified antenatal screening: Secondary | ICD-10-CM | POA: Diagnosis not present

## 2016-07-09 DIAGNOSIS — Z3A19 19 weeks gestation of pregnancy: Secondary | ICD-10-CM | POA: Insufficient documentation

## 2016-07-09 DIAGNOSIS — IMO0002 Reserved for concepts with insufficient information to code with codable children: Secondary | ICD-10-CM

## 2016-07-09 DIAGNOSIS — O09892 Supervision of other high risk pregnancies, second trimester: Secondary | ICD-10-CM | POA: Insufficient documentation

## 2016-07-09 DIAGNOSIS — Z141 Cystic fibrosis carrier: Secondary | ICD-10-CM | POA: Diagnosis not present

## 2016-07-09 DIAGNOSIS — O34219 Maternal care for unspecified type scar from previous cesarean delivery: Secondary | ICD-10-CM

## 2016-07-09 DIAGNOSIS — O0932 Supervision of pregnancy with insufficient antenatal care, second trimester: Secondary | ICD-10-CM

## 2016-07-09 DIAGNOSIS — O09522 Supervision of elderly multigravida, second trimester: Secondary | ICD-10-CM

## 2016-07-09 DIAGNOSIS — O0990 Supervision of high risk pregnancy, unspecified, unspecified trimester: Secondary | ICD-10-CM

## 2016-07-09 DIAGNOSIS — Z0489 Encounter for examination and observation for other specified reasons: Secondary | ICD-10-CM

## 2016-07-09 NOTE — Progress Notes (Signed)
Genetic Counseling  High-Risk Gestation Note  Appointment Date:  07/09/2016 Referred By: Roe Coombs, CNM Date of Birth:  08-Nov-1975  Pregnancy History: [id number] Estimated Date of Delivery: 12/02/16 Estimated Gestational Age: [redacted]w[redacted]d Attending: Alpha Gula, MD   [name] was seen for genetic counseling because of a maternal age of 40 and an abnormal cystic fibrosis carrier screen..     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 We reviewed chromosomes, nondisjunction, and the associated 1 in 24 risk for fetal aneuploidy related to a maternal age of 21 at [redacted]w[redacted]d gestation.  She was counseled that the risk for aneuploidy decreases as gestational age increases, accounting for those pregnancies which spontaneously abort. We specifically discussed Down syndrome (trisomy 70), trisomies 70 and 66, and sex chromosome aneuploidies (47,XXX and 47,XXY) including the common features and prognoses of each.   We also reviewed the results of [name] non-invasive prenatal screening (NIPS).  We discussed that NIPS analyzes placental cell free DNA in maternal circulation to evaluate for the presence of extra chromosome conditions.  Thus, it is able to provide risk assessment for specific  chromosome conditions, but is not diagnostic.  Specifically, [name] had MaterniT Genome, a brand of NIPS in which analysis is performed for aneuploidy, large (>7 Mb) deletions or duplications across the fetal genome, and select microdeletions. The results were screen negative, which significantly reduces the likelihood of a chromosome condition in the fetus. She understands that this technology is not diagnostic, although the sensitivity and specificity are high.   We reviewed addition screening options, including detailed ultrasound. She was counseled that up to 80% of fetuses with Down syndrome and up to 90% of fetuses with trisomies 13 and 18, when well visualized, have detectable anomalies or soft markers by ultrasound. [name] elected to have a detailed ultrasound today. Although views were somewhat limited by fetal positioning, there were no anomalies or markers for aneuploidy visualized. A follow-up ultrasound in 4 weeks was scheduled today.     We also discussed the availability of diagnostic testing via amniocentesis.  We reviewed the risks, benefits and limitations of amniocentesis including the approximate 1 in 500 risk for pregnancy complications following amniocentesis. We discussed the possible results that the tests might provide including: positive, negative, unanticipated, and no result. Finally, she was counseled regarding the cost of each option and potential out of pocket expenses.  After reviewing the above results and the available options, [name] expressed that she is not interested in pursuing diagnostic testing at this time or in the future, given the associated risk of complications.  She understands that ultrasound and NIPS cannot rule out all birth defects or genetic syndromes.    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 CF carrier testing has  not yet been performed  for her husband.  Consanguinity was denied. There is no known family history of CF.   We discussed that CF is a common genetic condition in the European Caucasian population occurring in approximately 1 in 72,300 Caucasian births. The epidemiology of CF is poorly known in [place]. A study in 2007 showed that the carrier frequency in [place] is similar to that in the European Caucasian population. Given this finding, approximately 1 in 33 [place] is a CF carrier. Delta F508 was reported as the most frequent mutation in this group.+  We spent time reviewing the autosomal recessive inheritance of cystic fibrosis and the 25% chance, when both parents are carriers, to have a child with CF.  We discussed that there are thought to be thousands of mutations which can cause the CF gene to not function properly. CF results in thickened secretions in the lungs, digestive and reproductive systems.  This life-limiting condition is characterized by chronic respiratory infections necessitating daily chest therapies, pancreatic dysfunction disrupting the body's ability to break down food and extract nutrients as it should, and possible infertility in males.  With the advent of new therapies, the average lifespan for people with CF is in the 30's.  Symptoms can be considerably variable, and in some cases symptoms may be quite mild.  Sometimes, the specific mutations a person carries can indicate whether their symptoms may be associated with milder or more severe symptoms; this is not always the case. Prior to carrier screening for the FOB, the chance for CF in a future pregnancy for the couple is approximately 1 in 100 given no known family history of CF and consanguinity was denied.   We discussed the option of carrier screening for the FOB. Carrier screening assess for the most common disease causing CFTR mutations. Thus, a negative carrier screen would reduce but not eliminate the chance to be a  carrier.  Alternatively, sequencing of CFTR is also available, which identifies 99% of carriers.  In the case that both partners are identified to be carriers, prenatal diagnosis via amniocentesis would be available. We reviewed the benefits, limitations, and risks of amniocentesis. We also discussed that in [place], the newborn screening test will detect CF, but that carriers may come back as false positives.  After careful consideration, [name] declined all further testing at this time. She will contact our office if her husband is interested in pursuing carrier screening in the future.   Both family histories were reviewed and found to be noncontributory for birth defects, intellectual disability, and known genetic conditions. Without further information regarding the provided family history, an accurate genetic risk cannot be calculated. Further genetic counseling is warranted if more information is obtained.  [name] denied exposure to environmental toxins or chemical agents. She denied the use of alcohol, tobacco or street drugs. She denied significant viral illnesses during the course of her pregnancy. Her medical and surgical histories were noncontributory. [name] appeared very tearful and overwhelmed today. She expressed that she is struggling with all that she has to accomplish on a daily basis and that her husband is away the majority of the time. We discussed supportive strategies and options. She reported that she doesn't have much support in the area, although she does have one brother who lives locally. We discussed the option of [name] seeing a counselor/therapist. She wishes to think about this option and will discuss with her referring provider if interested.   I counseled this patient regarding the above risks  and available options.  The approximate face-to-face time with the genetic counselor was 43 minutes.  Donald Prosehristy S. Kaira Stringfield, MS Certified Genetic  Counselor

## 2016-07-11 ENCOUNTER — Other Ambulatory Visit: Payer: Self-pay | Admitting: Certified Nurse Midwife

## 2016-07-11 DIAGNOSIS — O0990 Supervision of high risk pregnancy, unspecified, unspecified trimester: Secondary | ICD-10-CM

## 2016-07-19 ENCOUNTER — Encounter: Payer: Medicaid Other | Admitting: Certified Nurse Midwife

## 2016-07-22 ENCOUNTER — Ambulatory Visit (INDEPENDENT_AMBULATORY_CARE_PROVIDER_SITE_OTHER): Payer: Medicaid Other | Admitting: Certified Nurse Midwife

## 2016-07-22 ENCOUNTER — Encounter: Payer: Self-pay | Admitting: Certified Nurse Midwife

## 2016-07-22 VITALS — BP 117/74 | HR 81 | Wt 191.8 lb

## 2016-07-22 DIAGNOSIS — Z98891 History of uterine scar from previous surgery: Secondary | ICD-10-CM

## 2016-07-22 DIAGNOSIS — O0990 Supervision of high risk pregnancy, unspecified, unspecified trimester: Secondary | ICD-10-CM

## 2016-07-22 DIAGNOSIS — O09522 Supervision of elderly multigravida, second trimester: Secondary | ICD-10-CM | POA: Diagnosis not present

## 2016-07-22 DIAGNOSIS — O0932 Supervision of pregnancy with insufficient antenatal care, second trimester: Secondary | ICD-10-CM

## 2016-07-22 DIAGNOSIS — O34219 Maternal care for unspecified type scar from previous cesarean delivery: Secondary | ICD-10-CM

## 2016-07-22 NOTE — Progress Notes (Signed)
Patient has shoulder pain, headache, and fatigue. She is also having mood swings.

## 2016-07-22 NOTE — Progress Notes (Signed)
   PRENATAL VISIT NOTE  Subjective:  Renee [name] is a 41 y.o. [id number] at [redacted]w[redacted]d being seen today for ongoing prenatal care.  She is currently monitored for the following issues for this high-risk pregnancy and has Dental cavities; Supervision of high risk pregnancy, antepartum; H/O: C-section; AMA (advanced maternal age) multigravida 35+; Late prenatal care affecting pregnancy in second trimester; and Cystic fibrosis gene carrier on her problem list.  Patient reports headache, no bleeding, no contractions, no cramping, no leaking and reports stress HA, is at home with the kids for weeks on end, does not have outside support, states that she just needs a break.  Contractions: Not present. Vag. Bleeding: None.  Movement: Present. Denies leaking of fluid.   The following portions of the patient's history were reviewed and updated as appropriate: allergies, current medications, past family history, past medical history, past social history, past surgical history and problem list. Problem list updated.  Objective:   Vitals:   07/22/16 0841  BP: 117/74  Pulse: 81  Weight: 191 lb 12.8 oz (87 kg)    Fetal Status: Fetal Heart Rate (bpm): 152 Fundal Height: 22 cm Movement: Present     General:  Alert, oriented and cooperative. Patient is in no acute distress.  Skin: Skin is warm and dry. No rash noted.   Cardiovascular: Normal heart rate noted  Respiratory: Normal respiratory effort, no problems with respiration noted  Abdomen: Soft, gravid, appropriate for gestational age. Pain/Pressure: Absent     Pelvic:  Cervical exam deferred        Extremities: Normal range of motion.  Edema: None  Mental Status: Normal mood and affect. Normal behavior. Normal judgment and thought content.   Assessment and Plan:  Pregnancy: A5W0981 at [redacted]w[redacted]d  1. Supervision of high risk pregnancy, antepartum     Doing well.    2. Late prenatal care affecting pregnancy in second trimester       3. H/O: C-section  TOLAC  4. Elderly multigravida in second trimester       Preterm labor symptoms and general obstetric precautions including but not limited to vaginal bleeding, contractions, leaking of fluid and fetal movement were reviewed in detail with the patient. Please refer to After Visit Summary for other counseling recommendations.  Return in about 4 weeks (around 08/19/2016) for ROB.   Roe Coombs, CNM

## 2016-08-09 ENCOUNTER — Ambulatory Visit (HOSPITAL_COMMUNITY): Payer: Medicaid Other

## 2016-08-19 ENCOUNTER — Ambulatory Visit (INDEPENDENT_AMBULATORY_CARE_PROVIDER_SITE_OTHER): Payer: Medicaid Other | Admitting: Certified Nurse Midwife

## 2016-08-19 ENCOUNTER — Encounter: Payer: Self-pay | Admitting: Certified Nurse Midwife

## 2016-08-19 VITALS — BP 109/74 | HR 89 | Wt 194.2 lb

## 2016-08-19 DIAGNOSIS — Z98891 History of uterine scar from previous surgery: Secondary | ICD-10-CM

## 2016-08-19 DIAGNOSIS — O0992 Supervision of high risk pregnancy, unspecified, second trimester: Secondary | ICD-10-CM

## 2016-08-19 DIAGNOSIS — O09522 Supervision of elderly multigravida, second trimester: Secondary | ICD-10-CM

## 2016-08-19 DIAGNOSIS — Z141 Cystic fibrosis carrier: Secondary | ICD-10-CM

## 2016-08-19 DIAGNOSIS — O0932 Supervision of pregnancy with insufficient antenatal care, second trimester: Secondary | ICD-10-CM

## 2016-08-19 DIAGNOSIS — O0990 Supervision of high risk pregnancy, unspecified, unspecified trimester: Secondary | ICD-10-CM

## 2016-08-19 NOTE — Progress Notes (Signed)
   PRENATAL VISIT NOTE  Subjective:  Renee [name] is a 41 y.o. [id number] at 114w0d being seen today for ongoing prenatal care.  She is currently monitored for the following issues for this high-risk pregnancy and has Dental cavities; Supervision of high risk pregnancy, antepartum; H/O: C-section; AMA (advanced maternal age) multigravida 35+; Late prenatal care affecting pregnancy in second trimester; and Cystic fibrosis gene carrier on her problem list.  Patient reports no complaints.  Contractions: Not present. Vag. Bleeding: None.  Movement: Present. Denies leaking of fluid.   The following portions of the patient's history were reviewed and updated as appropriate: allergies, current medications, past family history, past medical history, past social history, past surgical history and problem list. Problem list updated.  Objective:   Vitals:   08/19/16 0838  BP: 109/74  Pulse: 89  Weight: 194 lb 3.2 oz (88.1 kg)    Fetal Status:     Movement: Present     General:  Alert, oriented and cooperative. Patient is in no acute distress.  Skin: Skin is warm and dry. No rash noted.   Cardiovascular: Normal heart rate noted  Respiratory: Normal respiratory effort, no problems with respiration noted  Abdomen: Soft, gravid, appropriate for gestational age. Pain/Pressure: Absent     Pelvic:  Cervical exam deferred        Extremities: Normal range of motion.  Edema: None  Mental Status: Normal mood and affect. Normal behavior. Normal judgment and thought content.   Assessment and Plan:  Pregnancy: [id number] at 4914w0d  1. Supervision of high risk pregnancy, antepartum     Doing well  2. H/O: C-section     TOLAC planned  3. Elderly multigravida in second trimester       4. Late prenatal care affecting pregnancy in second trimester       5. Cystic fibrosis gene carrier      Preterm labor symptoms and general obstetric precautions including but not limited to vaginal bleeding, contractions,  leaking of fluid and fetal movement were reviewed in detail with the patient. Please refer to After Visit Summary for other counseling recommendations.  Return in about 3 weeks (around 09/09/2016) for ROB, 2 hr OGTT.   Roe Coombsenney, Rachelle A, CNM

## 2016-08-19 NOTE — Progress Notes (Signed)
Patient is in the office, reports good fetal movement, denies pain/contractions. 

## 2016-08-23 ENCOUNTER — Other Ambulatory Visit (HOSPITAL_COMMUNITY): Payer: Self-pay | Admitting: Maternal and Fetal Medicine

## 2016-08-23 ENCOUNTER — Encounter (HOSPITAL_COMMUNITY): Payer: Self-pay

## 2016-08-23 ENCOUNTER — Ambulatory Visit (HOSPITAL_COMMUNITY)
Admission: RE | Admit: 2016-08-23 | Discharge: 2016-08-23 | Disposition: A | Discharge status: Home / Self Care | Payer: Medicaid Other | Source: Ambulatory Visit | Attending: Certified Nurse Midwife | Admitting: Certified Nurse Midwife

## 2016-08-23 VITALS — BP 112/62 | HR 80 | Wt 195.4 lb

## 2016-08-23 DIAGNOSIS — IMO0002 Reserved for concepts with insufficient information to code with codable children: Secondary | ICD-10-CM

## 2016-08-23 DIAGNOSIS — O26872 Cervical shortening, second trimester: Secondary | ICD-10-CM

## 2016-08-23 DIAGNOSIS — O09522 Supervision of elderly multigravida, second trimester: Secondary | ICD-10-CM | POA: Diagnosis present

## 2016-08-23 DIAGNOSIS — Z0489 Encounter for examination and observation for other specified reasons: Secondary | ICD-10-CM

## 2016-08-23 DIAGNOSIS — Z3A25 25 weeks gestation of pregnancy: Secondary | ICD-10-CM | POA: Insufficient documentation

## 2016-09-06 ENCOUNTER — Ambulatory Visit (HOSPITAL_COMMUNITY)
Admission: RE | Admit: 2016-09-06 | Discharge: 2016-09-06 | Disposition: A | Discharge status: Home / Self Care | Payer: Medicaid Other | Source: Ambulatory Visit | Attending: Certified Nurse Midwife | Admitting: Certified Nurse Midwife

## 2016-09-06 ENCOUNTER — Encounter (HOSPITAL_COMMUNITY): Payer: Self-pay

## 2016-09-06 ENCOUNTER — Other Ambulatory Visit (HOSPITAL_COMMUNITY): Payer: Self-pay | Admitting: Maternal and Fetal Medicine

## 2016-09-06 DIAGNOSIS — O99212 Obesity complicating pregnancy, second trimester: Secondary | ICD-10-CM

## 2016-09-06 DIAGNOSIS — O09522 Supervision of elderly multigravida, second trimester: Secondary | ICD-10-CM | POA: Diagnosis present

## 2016-09-06 DIAGNOSIS — Z141 Cystic fibrosis carrier: Secondary | ICD-10-CM

## 2016-09-06 DIAGNOSIS — Z3A27 27 weeks gestation of pregnancy: Secondary | ICD-10-CM | POA: Diagnosis not present

## 2016-09-06 DIAGNOSIS — O26872 Cervical shortening, second trimester: Secondary | ICD-10-CM | POA: Insufficient documentation

## 2016-09-06 DIAGNOSIS — O09892 Supervision of other high risk pregnancies, second trimester: Secondary | ICD-10-CM | POA: Insufficient documentation

## 2016-09-06 DIAGNOSIS — O34219 Maternal care for unspecified type scar from previous cesarean delivery: Secondary | ICD-10-CM | POA: Insufficient documentation

## 2016-09-06 DIAGNOSIS — O0990 Supervision of high risk pregnancy, unspecified, unspecified trimester: Secondary | ICD-10-CM

## 2016-09-10 ENCOUNTER — Other Ambulatory Visit: Payer: Medicaid Other

## 2016-09-11 ENCOUNTER — Ambulatory Visit (INDEPENDENT_AMBULATORY_CARE_PROVIDER_SITE_OTHER): Payer: Medicaid Other | Admitting: Certified Nurse Midwife

## 2016-09-11 ENCOUNTER — Other Ambulatory Visit: Payer: Medicaid Other

## 2016-09-11 ENCOUNTER — Encounter: Payer: Self-pay | Admitting: Certified Nurse Midwife

## 2016-09-11 VITALS — BP 116/74 | HR 79 | Wt 196.0 lb

## 2016-09-11 DIAGNOSIS — O09523 Supervision of elderly multigravida, third trimester: Secondary | ICD-10-CM | POA: Diagnosis not present

## 2016-09-11 DIAGNOSIS — R102 Pelvic and perineal pain: Secondary | ICD-10-CM

## 2016-09-11 DIAGNOSIS — O0993 Supervision of high risk pregnancy, unspecified, third trimester: Secondary | ICD-10-CM

## 2016-09-11 DIAGNOSIS — O09522 Supervision of elderly multigravida, second trimester: Secondary | ICD-10-CM

## 2016-09-11 DIAGNOSIS — O0932 Supervision of pregnancy with insufficient antenatal care, second trimester: Secondary | ICD-10-CM

## 2016-09-11 DIAGNOSIS — O34219 Maternal care for unspecified type scar from previous cesarean delivery: Secondary | ICD-10-CM | POA: Diagnosis not present

## 2016-09-11 DIAGNOSIS — O26899 Other specified pregnancy related conditions, unspecified trimester: Secondary | ICD-10-CM

## 2016-09-11 DIAGNOSIS — O0933 Supervision of pregnancy with insufficient antenatal care, third trimester: Secondary | ICD-10-CM

## 2016-09-11 DIAGNOSIS — O26893 Other specified pregnancy related conditions, third trimester: Secondary | ICD-10-CM | POA: Diagnosis not present

## 2016-09-11 DIAGNOSIS — Z141 Cystic fibrosis carrier: Secondary | ICD-10-CM | POA: Diagnosis not present

## 2016-09-11 DIAGNOSIS — Z98891 History of uterine scar from previous surgery: Secondary | ICD-10-CM

## 2016-09-11 DIAGNOSIS — O0990 Supervision of high risk pregnancy, unspecified, unspecified trimester: Secondary | ICD-10-CM

## 2016-09-11 MED ORDER — COMFORT FIT MATERNITY SUPP LG MISC: 1.0000 [IU] | Freq: Every day | 0 refills | Status: AC

## 2016-09-11 NOTE — Progress Notes (Signed)
Pt has concerns today regarding cervical change on last u/s, please review and discuss. Pt was given vaginal progesterone and is using.  Pt would like [facility]. Pt inquires about belly support band.

## 2016-09-11 NOTE — Progress Notes (Signed)
   PRENATAL VISIT NOTE  Subjective:  Renee [name] is a 41 y.o. [id number] at 160w2d being seen today for ongoing prenatal care.  She is currently monitored for the following issues for this high-risk pregnancy and has Dental cavities; Supervision of high risk pregnancy, antepartum; H/O: C-section; AMA (advanced maternal age) multigravida 35+; Late prenatal care affecting pregnancy in second trimester; and Cystic fibrosis gene carrier on her problem list.  Patient reports round ligament pain.  Contractions: Not present. Vag. Bleeding: None.  Movement: Present. Denies leaking of fluid.   The following portions of the patient's history were reviewed and updated as appropriate: allergies, current medications, past family history, past medical history, past social history, past surgical history and problem list. Problem list updated.  Objective:   Vitals:   09/11/16 0845  BP: 116/74  Pulse: 79  Weight: 196 lb (88.9 kg)    Fetal Status: Fetal Heart Rate (bpm): 144 Fundal Height: 29 cm Movement: Present     General:  Alert, oriented and cooperative. Patient is in no acute distress.  Skin: Skin is warm and dry. No rash noted.   Cardiovascular: Normal heart rate noted  Respiratory: Normal respiratory effort, no problems with respiration noted  Abdomen: Soft, gravid, appropriate for gestational age. Pain/Pressure: Present     Pelvic:  Cervical exam deferred        Extremities: Normal range of motion.     Mental Status: Normal mood and affect. Normal behavior. Normal judgment and thought content.   Assessment and Plan:  Pregnancy: [id number] at 1360w2d  1. Supervision of high risk pregnancy, antepartum  - Glucose Tolerance, 2 Hours w/1 Hour - CBC - HIV antibody - RPR - Elastic Bandages & Supports (COMFORT FIT MATERNITY SUPP LG) MISC; 1 Units by Does not apply route daily.  Dispense: 1 each; Refill: 0  2. H/O: C-section TOLAC planned  3. Elderly multigravida in second trimester     <40 years  of age  494. Late prenatal care affecting pregnancy in second trimester      @16  weeks  5. Cystic fibrosis gene carrier   6. Pain of round ligament during pregnancy  - Elastic Bandages & Supports (COMFORT FIT MATERNITY SUPP LG) MISC; 1 Units by Does not apply route daily.  Dispense: 1 each; Refill: 0  Preterm labor symptoms and general obstetric precautions including but not limited to vaginal bleeding, contractions, leaking of fluid and fetal movement were reviewed in detail with the patient. Please refer to After Visit Summary for other counseling recommendations.  [facility] needs to see FP MD here.  Roe Coombsachelle A Denney, CNM

## 2016-09-12 ENCOUNTER — Other Ambulatory Visit: Payer: Self-pay | Admitting: Certified Nurse Midwife

## 2016-09-12 DIAGNOSIS — O0990 Supervision of high risk pregnancy, unspecified, unspecified trimester: Secondary | ICD-10-CM

## 2016-09-12 LAB — HIV ANTIBODY (ROUTINE TESTING W REFLEX): HIV SCREEN 4TH GENERATION: NONREACTIVE

## 2016-09-12 LAB — CBC
Hematocrit: 39.2 % (ref 34.0–46.6)
Hemoglobin: 12.9 g/dL (ref 11.1–15.9)
MCH: 31.3 pg (ref 26.6–33.0)
MCHC: 32.9 g/dL (ref 31.5–35.7)
MCV: 95 fL (ref 79–97)
PLATELETS: 178 10*3/uL (ref 150–379)
RBC: 4.12 x10E6/uL (ref 3.77–5.28)
RDW: 13.2 % (ref 12.3–15.4)
WBC: 11.6 10*3/uL — AB (ref 3.4–10.8)

## 2016-09-12 LAB — GLUCOSE TOLERANCE, 2 HOURS W/ 1HR
GLUCOSE, 1 HOUR: 179 mg/dL (ref 65–179)
GLUCOSE, 2 HOUR: 149 mg/dL (ref 65–152)
GLUCOSE, FASTING: 86 mg/dL (ref 65–91)

## 2016-09-12 LAB — RPR: RPR Ser Ql: NONREACTIVE

## 2016-09-18 ENCOUNTER — Inpatient Hospital Stay (HOSPITAL_COMMUNITY)
Admission: AD | Admit: 2016-09-18 | Discharge: 2016-09-18 | Disposition: A | Discharge status: Home / Self Care | Payer: Medicaid Other | Source: Ambulatory Visit | Attending: Family Medicine | Admitting: Family Medicine

## 2016-09-18 ENCOUNTER — Encounter (HOSPITAL_COMMUNITY): Payer: Self-pay

## 2016-09-18 DIAGNOSIS — O26893 Other specified pregnancy related conditions, third trimester: Secondary | ICD-10-CM | POA: Diagnosis not present

## 2016-09-18 DIAGNOSIS — Z3A29 29 weeks gestation of pregnancy: Secondary | ICD-10-CM | POA: Insufficient documentation

## 2016-09-18 DIAGNOSIS — Z79899 Other long term (current) drug therapy: Secondary | ICD-10-CM | POA: Insufficient documentation

## 2016-09-18 DIAGNOSIS — M5441 Lumbago with sciatica, right side: Secondary | ICD-10-CM | POA: Diagnosis not present

## 2016-09-18 DIAGNOSIS — O9989 Other specified diseases and conditions complicating pregnancy, childbirth and the puerperium: Secondary | ICD-10-CM

## 2016-09-18 DIAGNOSIS — M5431 Sciatica, right side: Secondary | ICD-10-CM

## 2016-09-18 DIAGNOSIS — M549 Dorsalgia, unspecified: Secondary | ICD-10-CM

## 2016-09-18 DIAGNOSIS — K6289 Other specified diseases of anus and rectum: Secondary | ICD-10-CM | POA: Diagnosis present

## 2016-09-18 DIAGNOSIS — O99891 Other specified diseases and conditions complicating pregnancy: Secondary | ICD-10-CM

## 2016-09-18 LAB — URINALYSIS, ROUTINE W REFLEX MICROSCOPIC
Bilirubin Urine: NEGATIVE
Glucose, UA: NEGATIVE mg/dL
HGB URINE DIPSTICK: NEGATIVE
Ketones, ur: NEGATIVE mg/dL
LEUKOCYTES UA: NEGATIVE
Nitrite: NEGATIVE
PH: 5 (ref 5.0–8.0)
PROTEIN: NEGATIVE mg/dL
SPECIFIC GRAVITY, URINE: 1.025 (ref 1.005–1.030)

## 2016-09-18 MED ORDER — CYCLOBENZAPRINE HCL 10 MG PO TABS: 5.0000 mg | ORAL_TABLET | Freq: Three times a day (TID) | ORAL | 1 refills | Status: DC | PRN

## 2016-09-18 NOTE — MAU Note (Signed)
Patient presents with rectal pain that shoots down the right leg. Patient took tylenol with no relief. Fetus active.

## 2016-09-18 NOTE — MAU Provider Note (Signed)
Chief Complaint:  Rectal Pain   First Provider Initiated Contact with Patient 09/18/16 1646      HPI: Renee [name] is a 41 y.o. [id number] at 9253w2d who presents to maternity admissions reporting pain in her left lower back that radiates down into her rectum and down her left leg. Her pain is worse when standing/walking.  Lying down makes it better.  She has taken tylenol 650 mg x 1 dose today. She believes this pain is sciatica. She reports good fetal movement, denies uterine contractions, LOF, vaginal bleeding, vaginal itching/burning, urinary symptoms, h/a, dizziness, n/v, or fever/chills.    HPI  Past Medical History: Past Medical History:  Diagnosis Date  . Breast feeding status of mother    currently breast feeding    Past obstetric history: OB History  Gravida Para Term Preterm AB Living  4 2 2   1 2   SAB TAB Ectopic Multiple Live Births  1       2    # Outcome Date GA Lbr Len/2nd Weight Sex Delivery Anes PTL Lv  4 Current           3 Term 01/03/11 1930w1d  9 lb 12.8 oz (4.445 kg) M CS-LTranv EPI  LIV  2 Term 11/22/07 2230w0d   M Vag-Spont EPI N LIV  1 SAB 04/2005              Past Surgical History: Past Surgical History:  Procedure Laterality Date  . CESAREAN SECTION  01/03/2011   Procedure: CESAREAN SECTION;  Surgeon: Roseanna RainbowLisa A Jackson-Moore, MD;  Location: [facility];  Service: Gynecology;  Laterality: N/A;  . CHOLECYSTECTOMY  01/2008    Family History: History reviewed. No pertinent family history.  Social History: Social History  Substance Use Topics  . Smoking status: Never Smoker  . Smokeless tobacco: Never Used  . Alcohol use No    Allergies: No Known Allergies  Meds:  Prescriptions Prior to Admission  Medication Sig Dispense Refill Last Dose  . acetaminophen (TYLENOL) 325 MG tablet Take 650 mg by mouth every 6 (six) hours as needed for moderate pain.   09/18/2016 at Unknown time  . Prenatal Vit-Fe Fumarate-FA (MULTIVITAMIN-PRENATAL) 27-0.8 MG TABS tablet Take 1  tablet by mouth daily at 12 noon.   09/18/2016 at Unknown time  . progesterone (PROMETRIUM) 200 MG capsule Place 200 mg vaginally at bedtime.    09/17/2016 at Unknown time  . Elastic Bandages & Supports (COMFORT FIT MATERNITY SUPP LG) MISC 1 Units by Does not apply route daily. 1 each 0     ROS:  Review of Systems  Constitutional: Negative for chills, fatigue and fever.  Eyes: Negative for visual disturbance.  Respiratory: Negative for shortness of breath.   Cardiovascular: Negative for chest pain.  Gastrointestinal: Negative for abdominal pain, nausea and vomiting.  Genitourinary: Positive for pelvic pain. Negative for difficulty urinating, dysuria, flank pain, vaginal bleeding, vaginal discharge and vaginal pain.  Musculoskeletal: Positive for back pain.  Neurological: Positive for weakness. Negative for dizziness and headaches.  Psychiatric/Behavioral: Negative.      I have reviewed patient's Past Medical Hx, Surgical Hx, Family Hx, Social Hx, medications and allergies.   Physical Exam   Patient Vitals for the past 24 hrs:  BP Temp Temp src Pulse Resp  09/18/16 1620 110/64 98.3 F (36.8 C) Oral 92 18   Constitutional: Well-developed, well-nourished female in moderate distress.  Cardiovascular: normal rate Respiratory: normal effort GI: Abd soft, non-tender, gravid appropriate for gestational age.  MS: Extremities nontender, no edema, normal ROM Neurologic: Alert and oriented x 4.  GU: Neg CVAT.  PELVIC EXAM: Cervix pink, visually closed, without lesion, scant white creamy discharge, vaginal walls and external genitalia normal Bimanual exam: Cervix 0/long/high, firm, anterior, neg CMT, uterus nontender, nonenlarged, adnexa without tenderness, enlargement, or mass     FHT:  Baseline 150 , moderate variability, accelerations present, no decelerations Contractions: None on toco or to palpation   Labs: Results for orders placed or performed during the hospital encounter of  09/18/16 (from the past 24 hour(s))  Urinalysis, Routine w reflex microscopic     Status: None   Collection Time: 09/18/16  4:00 PM  Result Value Ref Range   Color, Urine YELLOW YELLOW   APPearance CLEAR CLEAR   Specific Gravity, Urine 1.025 1.005 - 1.030   pH 5.0 5.0 - 8.0   Glucose, UA NEGATIVE NEGATIVE mg/dL   Hgb urine dipstick NEGATIVE NEGATIVE   Bilirubin Urine NEGATIVE NEGATIVE   Ketones, ur NEGATIVE NEGATIVE mg/dL   Protein, ur NEGATIVE NEGATIVE mg/dL   Nitrite NEGATIVE NEGATIVE   Leukocytes, UA NEGATIVE NEGATIVE   O/Positive/-- (03/09 1223)  Imaging:    MAU Course/MDM: I have ordered labs and reviewed results.  NST reviewed and reactive Pain is c/w sciatic nerve pain, no evidence of preterm labor or UTI Unable to give sedating medications in MAU b/c patient is driving. Rx for Flexeril 5-10 mg TID PRN Recommend pt seek chiropractic care by provider experienced with pregnant patients Rest/ice/heat/Tylenol/pregnancy support belt Exercises for sciatica given, may need to modify related to pregnancy but can try simple stretches daily Pt to f/u with [facility] and return to MAU as needed for emergencies Pt stable at time of discharge.   Assessment: 1. Sciatica of right side   2. Back pain affecting pregnancy in third trimester     Plan: Discharge home Labor precautions and fetal kick counts Follow-up Information    [facility] Follow up.   Why:  As scheduled, return to MAU as needed for emergencies Contact information: [address] [place] [address] [phone]       Chiropractor of your choice Follow up.   Why:  For treatment of sciatica in pregnancy         Allergies as of 09/18/2016   No Known Allergies     Medication List    TAKE these medications   acetaminophen 325 MG tablet Commonly known as:  TYLENOL Take 650 mg by mouth every 6 (six) hours as needed for moderate pain.   COMFORT FIT MATERNITY  SUPP LG Misc 1 Units by Does not apply route daily.   cyclobenzaprine 10 MG tablet Commonly known as:  FLEXERIL Take 0.5-1 tablets (5-10 mg total) by mouth 3 (three) times daily as needed for muscle spasms.   multivitamin-prenatal 27-0.8 MG Tabs tablet Take 1 tablet by mouth daily at 12 noon.   progesterone 200 MG capsule Commonly known as:  PROMETRIUM Place 200 mg vaginally at bedtime.       Sharen Counter Certified Nurse-Midwife 09/18/2016 6:08 PM

## 2016-09-24 ENCOUNTER — Encounter: Payer: Self-pay | Admitting: *Deleted

## 2016-09-26 ENCOUNTER — Ambulatory Visit (INDEPENDENT_AMBULATORY_CARE_PROVIDER_SITE_OTHER): Payer: Medicaid Other | Admitting: Obstetrics and Gynecology

## 2016-09-26 VITALS — BP 131/84 | HR 81 | Wt 199.0 lb

## 2016-09-26 DIAGNOSIS — O0933 Supervision of pregnancy with insufficient antenatal care, third trimester: Secondary | ICD-10-CM

## 2016-09-26 DIAGNOSIS — Z98891 History of uterine scar from previous surgery: Secondary | ICD-10-CM | POA: Diagnosis not present

## 2016-09-26 DIAGNOSIS — O0993 Supervision of high risk pregnancy, unspecified, third trimester: Secondary | ICD-10-CM | POA: Diagnosis not present

## 2016-09-26 DIAGNOSIS — O09523 Supervision of elderly multigravida, third trimester: Secondary | ICD-10-CM

## 2016-09-26 DIAGNOSIS — O0990 Supervision of high risk pregnancy, unspecified, unspecified trimester: Secondary | ICD-10-CM

## 2016-09-26 DIAGNOSIS — O0932 Supervision of pregnancy with insufficient antenatal care, second trimester: Secondary | ICD-10-CM

## 2016-09-26 NOTE — Progress Notes (Signed)
   PRENATAL VISIT NOTE  Subjective:  Renee [name] is a 41 y.o. [id number] at 3662w3d being seen today for ongoing prenatal care.  She is currently monitored for the following issues for this high-risk pregnancy and has Dental cavities; Supervision of high risk pregnancy, antepartum; H/O: C-section; AMA (advanced maternal age) multigravida 35+; Late prenatal care affecting pregnancy in second trimester; and Cystic fibrosis gene carrier on her problem list.  Patient reports no complaints.  Contractions: Not present. Vag. Bleeding: None.  Movement: Present. Denies leaking of fluid.   The following portions of the patient's history were reviewed and updated as appropriate: allergies, current medications, past family history, past medical history, past social history, past surgical history and problem list. Problem list updated.  Objective:   Vitals:   09/26/16 1535  BP: 131/84  Pulse: 81  Weight: 199 lb (90.3 kg)    Fetal Status:     Movement: Present     General:  Alert, oriented and cooperative. Patient is in no acute distress.  Skin: Skin is warm and dry. No rash noted.   Cardiovascular: Normal heart rate noted  Respiratory: Normal respiratory effort, no problems with respiration noted  Abdomen: Soft, gravid, appropriate for gestational age. Pain/Pressure: Present     Pelvic:  Cervical exam deferred        Extremities: Normal range of motion.  Edema: None  Mental Status: Normal mood and affect. Normal behavior. Normal judgment and thought content.   Assessment and Plan:  Pregnancy: [id number] at 2162w3d  1. Supervision of high risk pregnancy, antepartum Patient is doing well without complaints Continue prometrium for incidental finding of short cervix Normal 2 hr glucola Patient plans IUD for contraception  2. Elderly multigravida in third trimester Normal NIPS  3. H/O: C-section Desires TOLAC  4. Late prenatal care affecting pregnancy in second trimester   Preterm labor symptoms  and general obstetric precautions including but not limited to vaginal bleeding, contractions, leaking of fluid and fetal movement were reviewed in detail with the patient. Please refer to After Visit Summary for other counseling recommendations.  Return in about 2 weeks (around 10/10/2016) for ROB.   Catalina AntiguaPeggy Kilian Schwartz, MD

## 2016-10-09 ENCOUNTER — Ambulatory Visit (INDEPENDENT_AMBULATORY_CARE_PROVIDER_SITE_OTHER): Payer: Medicaid Other | Admitting: Obstetrics and Gynecology

## 2016-10-09 VITALS — BP 121/73 | HR 80 | Wt 203.0 lb

## 2016-10-09 DIAGNOSIS — O09523 Supervision of elderly multigravida, third trimester: Secondary | ICD-10-CM

## 2016-10-09 DIAGNOSIS — O0993 Supervision of high risk pregnancy, unspecified, third trimester: Secondary | ICD-10-CM | POA: Diagnosis not present

## 2016-10-09 DIAGNOSIS — Z98891 History of uterine scar from previous surgery: Secondary | ICD-10-CM

## 2016-10-09 DIAGNOSIS — O26873 Cervical shortening, third trimester: Secondary | ICD-10-CM | POA: Diagnosis not present

## 2016-10-09 DIAGNOSIS — O26879 Cervical shortening, unspecified trimester: Secondary | ICD-10-CM

## 2016-10-09 DIAGNOSIS — Z141 Cystic fibrosis carrier: Secondary | ICD-10-CM

## 2016-10-09 DIAGNOSIS — O0990 Supervision of high risk pregnancy, unspecified, unspecified trimester: Secondary | ICD-10-CM

## 2016-10-09 NOTE — Progress Notes (Signed)
Subjective:  Renee [name] is a 41 y.o. [phone] at 7126w2d being seen today for ongoing prenatal care.  She is currently monitored for the following issues for this high-risk pregnancy and has Dental cavities; Supervision of high risk pregnancy, antepartum; H/O: C-section; AMA (advanced maternal age) multigravida 35+; Late prenatal care affecting pregnancy in second trimester; Cystic fibrosis gene carrier; and Cervix, short (affecting pregnancy) on her problem list.  Patient reports no complaints.  Contractions: Not present. Vag. Bleeding: None.  Movement: Present. Denies leaking of fluid.   The following portions of the patient's history were reviewed and updated as appropriate: allergies, current medications, past family history, past medical history, past social history, past surgical history and problem list. Problem list updated.  Objective:   Vitals:   10/09/16 1443  BP: 121/73  Pulse: 80  Weight: 203 lb (92.1 kg)    Fetal Status:     Movement: Present     General:  Alert, oriented and cooperative. Patient is in no acute distress.  Skin: Skin is warm and dry. No rash noted.   Cardiovascular: Normal heart rate noted  Respiratory: Normal respiratory effort, no problems with respiration noted  Abdomen: Soft, gravid, appropriate for gestational age. Pain/Pressure: Absent     Pelvic:  Cervical exam deferred        Extremities: Normal range of motion.  Edema: None  Mental Status: Normal mood and affect. Normal behavior. Normal judgment and thought content.   Urinalysis:      Assessment and Plan:  Pregnancy: [id number] at 6226w2d  1. Supervision of high risk pregnancy, antepartum Stable Considering PPBTL vs IUD Will have pt sign Medicaid papers today. IUD information provided to pt  2. Cystic fibrosis gene carrier Stable  3. Elderly multigravida in third trimester Negative Quad screen  4. H/O: C-section Desires TOLAC, papers signed 06/26/16  5. Cervix, short (affecting  pregnancy) Stable No S/Sx of PTL Continue with Prometrium  Preterm labor symptoms and general obstetric precautions including but not limited to vaginal bleeding, contractions, leaking of fluid and fetal movement were reviewed in detail with the patient. Please refer to After Visit Summary for other counseling recommendations.  Return in about 2 weeks (around 10/23/2016) for OB visit.   Hermina StaggersErvin, Renee Biber L, MD

## 2016-10-09 NOTE — Patient Instructions (Signed)
Postpartum Tubal Ligation Postpartum tubal ligation (PPTL) is a procedure to close the fallopian tubes. This is done so that you cannot get pregnant. When the fallopian tubes are closed, the eggs that the ovaries release cannot enter the uterus, and sperm cannot reach the eggs. PPTL is done right after childbirth or 1-2 days after childbirth, before the uterus returns to its normal location. PPTL is sometimes called "getting your tubes tied." You should not have this procedure if you want to get pregnant someday or if you are unsure about having more children. Tell a health care provider about:  Any allergies you have.  All medicines you are taking, including vitamins, herbs, eye drops, creams, and over-the-counter medicines.  Previous problems you or members of your family have had with the use of anesthetics.  Any blood disorders you have.  Previous surgeries you have had.  Any medical conditions you may have.  Any past pregnancies. What are the risks? Generally, this is a safe procedure. However, problems may occur, including:  Infection.  Bleeding.  Injury to surrounding organs.  Side effects from anesthetics.  Failure of the procedure.  This procedure can increase your risk of a kind of pregnancy in which a fertilized egg attaches to the outside of the uterus (ectopic pregnancy). What happens before the procedure?  Ask your health care provider about: ? How much pain you can expect to have. ? What medicines you will be given for pain, especially if you are planning to breastfeed.  Follow instructions from your health care provider about eating and drinking restrictions. What happens during the procedure? If you had a vaginal delivery:  You may be given one or more of the following: ? A medicine that helps you relax (sedative). ? A medicine to numb the area (local anesthetic). ? A medicine to make you fall asleep (general anesthetic). ? A medicine that is injected  into an area of your body to numb everything below the injection site (regional anesthetic).  If you have been given a general anesthetic, a tube will be put down your throat to help you breathe.  An IV tube will be inserted into one of your veins to give you medicines and fluids during the procedure.  Your bladder may be emptied with a small tube (catheter).  An incision will be made just below your belly button.  Your fallopian tubes will be located and brought up through the incision.  Your fallopian tubes will be tied off, burned (cauterized), or blocked with a clip, ring, or clamp. A small portion in the center of each fallopian tube may be removed.  The incision will be closed with stitches (sutures).  A bandage (dressing) will be placed over the incision.  If you had a cesarean delivery:  Tubal ligation will be done through the incision that was used for the cesarean delivery of your baby.  The incision will be closed with sutures.  A dressing will be placed over the incision.  The procedure may vary among health care providers and hospitals. What happens after the procedure?  Your blood pressure, heart rate, breathing rate, and blood oxygen level will be monitored often until the medicines you were given have worn off.  You will be given pain medicine as needed.  Do not drive for 24 hours if you received a sedative. This information is not intended to replace advice given to you by your health care provider. Make sure you discuss any questions you have with your health   care provider. Document Released: 04/01/2005 Document Revised: 09/04/2015 Document Reviewed: 03/12/2015 Elsevier Interactive Patient Education  2018 [employer]  

## 2016-10-23 ENCOUNTER — Ambulatory Visit (INDEPENDENT_AMBULATORY_CARE_PROVIDER_SITE_OTHER): Payer: Medicaid Other | Admitting: Obstetrics & Gynecology

## 2016-10-23 VITALS — BP 100/67 | HR 81 | Wt 206.0 lb

## 2016-10-23 DIAGNOSIS — O0993 Supervision of high risk pregnancy, unspecified, third trimester: Secondary | ICD-10-CM

## 2016-10-23 DIAGNOSIS — O26873 Cervical shortening, third trimester: Secondary | ICD-10-CM

## 2016-10-23 DIAGNOSIS — O26879 Cervical shortening, unspecified trimester: Secondary | ICD-10-CM

## 2016-10-23 DIAGNOSIS — O0990 Supervision of high risk pregnancy, unspecified, unspecified trimester: Secondary | ICD-10-CM

## 2016-10-23 NOTE — Progress Notes (Signed)
   PRENATAL VISIT NOTE  Subjective:  Renee [name] is a 41 y.o. [id number] at 5274w2d being seen today for ongoing prenatal care.  She is currently monitored for the following issues for this high-risk pregnancy and has Dental cavities; Supervision of high risk pregnancy, antepartum; H/O: C-section; AMA (advanced maternal age) multigravida 35+; Late prenatal care affecting pregnancy in second trimester; Cystic fibrosis gene carrier; and Cervix, short (affecting pregnancy) on her problem list.  Patient reports no complaints.  Contractions: Not present. Vag. Bleeding: None.  Movement: Present. Denies leaking of fluid.   The following portions of the patient's history were reviewed and updated as appropriate: allergies, current medications, past family history, past medical history, past social history, past surgical history and problem list. Problem list updated.  Objective:   Vitals:   10/23/16 1533  BP: 100/67  Pulse: 81  Weight: 206 lb (93.4 kg)    Fetal Status: Fetal Heart Rate (bpm): 152   Movement: Present     General:  Alert, oriented and cooperative. Patient is in no acute distress.  Skin: Skin is warm and dry. No rash noted.   Cardiovascular: Normal heart rate noted  Respiratory: Normal respiratory effort, no problems with respiration noted  Abdomen: Soft, gravid, appropriate for gestational age. Pain/Pressure: Present     Pelvic:  Cervical exam deferred        Extremities: Normal range of motion.  Edema: None  Mental Status: Normal mood and affect. Normal behavior. Normal judgment and thought content.   Assessment and Plan:  Pregnancy: [id number] at 574w2d  1. Supervision of high risk pregnancy, antepartum   2. Cervix, short (affecting pregnancy) Continue to at least 36 weeks  Preterm labor symptoms and general obstetric precautions including but not limited to vaginal bleeding, contractions, leaking of fluid and fetal movement were reviewed in detail with the patient. Please  refer to After Visit Summary for other counseling recommendations.  Return in about 2 weeks (around 11/06/2016).   Scheryl DarterJames Nneoma Harral, MD

## 2016-10-23 NOTE — Progress Notes (Signed)
Pt questions should she stop taking the Prometrium.

## 2016-10-23 NOTE — Patient Instructions (Signed)
Third Trimester of Pregnancy The third trimester is from week 28 through week 40 (months 7 through 9). The third trimester is a time when the unborn baby (fetus) is growing rapidly. At the end of the ninth month, the fetus is about 20 inches in length and weighs 6-10 pounds. Body changes during your third trimester Your body will continue to go through many changes during pregnancy. The changes vary from woman to woman. During the third trimester:  Your weight will continue to increase. You can expect to gain 25-35 pounds (11-16 kg) by the end of the pregnancy.  You may begin to get stretch marks on your hips, abdomen, and breasts.  You may urinate more often because the fetus is moving lower into your pelvis and pressing on your bladder.  You may develop or continue to have heartburn. This is caused by increased hormones that slow down muscles in the digestive tract.  You may develop or continue to have constipation because increased hormones slow digestion and cause the muscles that push waste through your intestines to relax.  You may develop hemorrhoids. These are swollen veins (varicose veins) in the rectum that can itch or be painful.  You may develop swollen, bulging veins (varicose veins) in your legs.  You may have increased body aches in the pelvis, back, or thighs. This is due to weight gain and increased hormones that are relaxing your joints.  You may have changes in your hair. These can include thickening of your hair, rapid growth, and changes in texture. Some women also have hair loss during or after pregnancy, or hair that feels dry or thin. Your hair will most likely return to normal after your baby is born.  Your breasts will continue to grow and they will continue to become tender. A yellow fluid (colostrum) may leak from your breasts. This is the first milk you are producing for your baby.  Your belly button may stick out.  You may notice more swelling in your hands,  face, or ankles.  You may have increased tingling or numbness in your hands, arms, and legs. The skin on your belly may also feel numb.  You may feel short of breath because of your expanding uterus.  You may have more problems sleeping. This can be caused by the size of your belly, increased need to urinate, and an increase in your body's metabolism.  You may notice the fetus "dropping," or moving lower in your abdomen (lightening).  You may have increased vaginal discharge.  You may notice your joints feel loose and you may have pain around your pelvic bone.  What to expect at prenatal visits You will have prenatal exams every 2 weeks until week 36. Then you will have weekly prenatal exams. During a routine prenatal visit:  You will be weighed to make sure you and the baby are growing normally.  Your blood pressure will be taken.  Your abdomen will be measured to track your baby's growth.  The fetal heartbeat will be listened to.  Any test results from the previous visit will be discussed.  You may have a cervical check near your due date to see if your cervix has softened or thinned (effaced).  You will be tested for Group B streptococcus. This happens between 35 and 37 weeks.  Your health care provider may ask you:  What your birth plan is.  How you are feeling.  If you are feeling the baby move.  If you have had   any abnormal symptoms, such as leaking fluid, bleeding, severe headaches, or abdominal cramping.  If you are using any tobacco products, including cigarettes, chewing tobacco, and electronic cigarettes.  If you have any questions.  Other tests or screenings that may be performed during your third trimester include:  Blood tests that check for low iron levels (anemia).  Fetal testing to check the health, activity level, and growth of the fetus. Testing is done if you have certain medical conditions or if there are problems during the  pregnancy.  Nonstress test (NST). This test checks the health of your baby to make sure there are no signs of problems, such as the baby not getting enough oxygen. During this test, a belt is placed around your belly. The baby is made to move, and its heart rate is monitored during movement.  What is false labor? False labor is a condition in which you feel small, irregular tightenings of the muscles in the womb (contractions) that usually go away with rest, changing position, or drinking water. These are called Braxton Hicks contractions. Contractions may last for hours, days, or even weeks before true labor sets in. If contractions come at regular intervals, become more frequent, increase in intensity, or become painful, you should see your health care provider. What are the signs of labor?  Abdominal cramps.  Regular contractions that start at 10 minutes apart and become stronger and more frequent with time.  Contractions that start on the top of the uterus and spread down to the lower abdomen and back.  Increased pelvic pressure and dull back pain.  A watery or bloody mucus discharge that comes from the vagina.  Leaking of amniotic fluid. This is also known as your "water breaking." It could be a slow trickle or a gush. Let your health care provider know if it has a color or strange odor. If you have any of these signs, call your health care provider right away, even if it is before your due date. Follow these instructions at home: Medicines  Follow your health care provider's instructions regarding medicine use. Specific medicines may be either safe or unsafe to take during pregnancy.  Take a prenatal vitamin that contains at least 600 micrograms (mcg) of folic acid.  If you develop constipation, try taking a stool softener if your health care provider approves. Eating and drinking  Eat a balanced diet that includes fresh fruits and vegetables, whole grains, good sources of protein  such as meat, eggs, or tofu, and low-fat dairy. Your health care provider will help you determine the amount of weight gain that is right for you.  Avoid raw meat and uncooked cheese. These carry germs that can cause birth defects in the baby.  If you have low calcium intake from food, talk to your health care provider about whether you should take a daily calcium supplement.  Eat four or five small meals rather than three large meals a day.  Limit foods that are high in fat and processed sugars, such as fried and sweet foods.  To prevent constipation: ? Drink enough fluid to keep your urine clear or pale yellow. ? Eat foods that are high in fiber, such as fresh fruits and vegetables, whole grains, and beans. Activity  Exercise only as directed by your health care provider. Most women can continue their usual exercise routine during pregnancy. Try to exercise for 30 minutes at least 5 days a week. Stop exercising if you experience uterine contractions.  Avoid heavy   lifting.  Do not exercise in extreme heat or humidity, or at high altitudes.  Wear low-heel, comfortable shoes.  Practice good posture.  You may continue to have sex unless your health care provider tells you otherwise. Relieving pain and discomfort  Take frequent breaks and rest with your legs elevated if you have leg cramps or low back pain.  Take warm sitz baths to soothe any pain or discomfort caused by hemorrhoids. Use hemorrhoid cream if your health care provider approves.  Wear a good support bra to prevent discomfort from breast tenderness.  If you develop varicose veins: ? Wear support pantyhose or compression stockings as told by your healthcare provider. ? Elevate your feet for 15 minutes, 3-4 times a day. Prenatal care  Write down your questions. Take them to your prenatal visits.  Keep all your prenatal visits as told by your health care provider. This is important. Safety  Wear your seat belt at  all times when driving.  Make a list of emergency phone numbers, including numbers for family, friends, the hospital, and police and fire departments. General instructions  Avoid cat litter boxes and soil used by cats. These carry germs that can cause birth defects in the baby. If you have a cat, ask someone to clean the litter box for you.  Do not travel far distances unless it is absolutely necessary and only with the approval of your health care provider.  Do not use hot tubs, steam rooms, or saunas.  Do not drink alcohol.  Do not use any products that contain nicotine or tobacco, such as cigarettes and e-cigarettes. If you need help quitting, ask your health care provider.  Do not use any medicinal herbs or unprescribed drugs. These chemicals affect the formation and growth of the baby.  Do not douche or use tampons or scented sanitary pads.  Do not cross your legs for long periods of time.  To prepare for the arrival of your baby: ? Take prenatal classes to understand, practice, and ask questions about labor and delivery. ? Make a trial run to the hospital. ? Visit the hospital and tour the maternity area. ? Arrange for maternity or paternity leave through employers. ? Arrange for family and friends to take care of pets while you are in the hospital. ? Purchase a rear-facing car seat and make sure you know how to install it in your car. ? Pack your hospital bag. ? Prepare the baby's nursery. Make sure to remove all pillows and stuffed animals from the baby's crib to prevent suffocation.  Visit your dentist if you have not gone during your pregnancy. Use a soft toothbrush to brush your teeth and be gentle when you floss. Contact a health care provider if:  You are unsure if you are in labor or if your water has broken.  You become dizzy.  You have mild pelvic cramps, pelvic pressure, or nagging pain in your abdominal area.  You have lower back pain.  You have persistent  nausea, vomiting, or diarrhea.  You have an unusual or bad smelling vaginal discharge.  You have pain when you urinate. Get help right away if:  Your water breaks before 37 weeks.  You have regular contractions less than 5 minutes apart before 37 weeks.  You have a fever.  You are leaking fluid from your vagina.  You have spotting or bleeding from your vagina.  You have severe abdominal pain or cramping.  You have rapid weight loss or weight gain.    You have shortness of breath with chest pain.  You notice sudden or extreme swelling of your face, hands, ankles, feet, or legs.  Your baby makes fewer than 10 movements in 2 hours.  You have severe headaches that do not go away when you take medicine.  You have vision changes. Summary  The third trimester is from week 28 through week 40, months 7 through 9. The third trimester is a time when the unborn baby (fetus) is growing rapidly.  During the third trimester, your discomfort may increase as you and your baby continue to gain weight. You may have abdominal, leg, and back pain, sleeping problems, and an increased need to urinate.  During the third trimester your breasts will keep growing and they will continue to become tender. A yellow fluid (colostrum) may leak from your breasts. This is the first milk you are producing for your baby.  False labor is a condition in which you feel small, irregular tightenings of the muscles in the womb (contractions) that eventually go away. These are called Braxton Hicks contractions. Contractions may last for hours, days, or even weeks before true labor sets in.  Signs of labor can include: abdominal cramps; regular contractions that start at 10 minutes apart and become stronger and more frequent with time; watery or bloody mucus discharge that comes from the vagina; increased pelvic pressure and dull back pain; and leaking of amniotic fluid. This information is not intended to replace advice  given to you by your health care provider. Make sure you discuss any questions you have with your health care provider. Document Released: 03/26/2001 Document Revised: 09/07/2015 Document Reviewed: 06/02/2012 Elsevier Interactive Patient Education  2017 [employer]  

## 2016-11-05 ENCOUNTER — Other Ambulatory Visit (HOSPITAL_COMMUNITY)
Admission: RE | Admit: 2016-11-05 | Discharge: 2016-11-05 | Disposition: A | Discharge status: Home / Self Care | Payer: Medicaid Other | Source: Ambulatory Visit | Attending: Obstetrics and Gynecology | Admitting: Obstetrics and Gynecology

## 2016-11-05 ENCOUNTER — Ambulatory Visit (INDEPENDENT_AMBULATORY_CARE_PROVIDER_SITE_OTHER): Payer: Medicaid Other | Admitting: Obstetrics and Gynecology

## 2016-11-05 VITALS — BP 123/77 | HR 89 | Wt 205.0 lb

## 2016-11-05 DIAGNOSIS — O09523 Supervision of elderly multigravida, third trimester: Secondary | ICD-10-CM

## 2016-11-05 DIAGNOSIS — O0932 Supervision of pregnancy with insufficient antenatal care, second trimester: Secondary | ICD-10-CM

## 2016-11-05 DIAGNOSIS — O0990 Supervision of high risk pregnancy, unspecified, unspecified trimester: Secondary | ICD-10-CM

## 2016-11-05 DIAGNOSIS — Z98891 History of uterine scar from previous surgery: Secondary | ICD-10-CM

## 2016-11-05 DIAGNOSIS — O0993 Supervision of high risk pregnancy, unspecified, third trimester: Secondary | ICD-10-CM

## 2016-11-05 DIAGNOSIS — O0933 Supervision of pregnancy with insufficient antenatal care, third trimester: Secondary | ICD-10-CM

## 2016-11-05 LAB — OB RESULTS CONSOLE GC/CHLAMYDIA: Gonorrhea: NEGATIVE

## 2016-11-05 MED ORDER — FAMOTIDINE 20 MG PO TABS: 20.0000 mg | ORAL_TABLET | Freq: Two times a day (BID) | ORAL | 3 refills | Status: AC

## 2016-11-05 NOTE — Progress Notes (Signed)
   PRENATAL VISIT NOTE  Subjective:  Gwendel Hansonadia Fentress is a 41 y.o. [id number] at 6391w1d being seen today for ongoing prenatal care.  She is currently monitored for the following issues for this high-risk pregnancy and has Dental cavities; Supervision of high risk pregnancy, antepartum; H/O: C-section; AMA (advanced maternal age) multigravida 35+; Late prenatal care affecting pregnancy in second trimester; Cystic fibrosis gene carrier; and Cervix, short (affecting pregnancy) on her problem list.  Patient reports no complaints.  Contractions: Not present. Vag. Bleeding: None.  Movement: Present. Denies leaking of fluid.   The following portions of the patient's history were reviewed and updated as appropriate: allergies, current medications, past family history, past medical history, past social history, past surgical history and problem list. Problem list updated.  Objective:   Vitals:   11/05/16 1051  BP: 123/77  Pulse: 89  Weight: 205 lb (93 kg)    Fetal Status: Fetal Heart Rate (bpm): 157 Fundal Height: 36 cm Movement: Present  Presentation: Vertex  General:  Alert, oriented and cooperative. Patient is in no acute distress.  Skin: Skin is warm and dry. No rash noted.   Cardiovascular: Normal heart rate noted  Respiratory: Normal respiratory effort, no problems with respiration noted  Abdomen: Soft, gravid, appropriate for gestational age.  Pain/Pressure: Present     Pelvic: Cervical exam performed Dilation: 1 Effacement (%): Thick Station: -3  Extremities: Normal range of motion.  Edema: None  Mental Status:  Normal mood and affect. Normal behavior. Normal judgment and thought content.   Assessment and Plan:  Pregnancy: [id number] at 2691w1d  1. Supervision of high risk pregnancy, antepartum Patient is doing well Cultures collected today Rx pepcid provided for heartburn Patient now desires BTL  2. Elderly multigravida in third trimester Normal NIPS  3. H/O: C-section Desires  TOLAC  4. Late prenatal care affecting pregnancy in second trimester   Preterm labor symptoms and general obstetric precautions including but not limited to vaginal bleeding, contractions, leaking of fluid and fetal movement were reviewed in detail with the patient. Please refer to After Visit Summary for other counseling recommendations.  No Follow-up on file.   Catalina AntiguaPeggy Jaclynn Laumann, MD

## 2016-11-06 LAB — GC/CHLAMYDIA PROBE AMP (~~LOC~~) NOT AT ARMC
CHLAMYDIA, DNA PROBE: NEGATIVE
NEISSERIA GONORRHEA: NEGATIVE

## 2016-11-07 LAB — STREP GP B NAA: Strep Gp B NAA: NEGATIVE

## 2016-11-11 ENCOUNTER — Ambulatory Visit (INDEPENDENT_AMBULATORY_CARE_PROVIDER_SITE_OTHER): Payer: Medicaid Other | Admitting: Obstetrics and Gynecology

## 2016-11-11 VITALS — BP 127/78 | HR 98 | Wt 206.2 lb

## 2016-11-11 DIAGNOSIS — O09523 Supervision of elderly multigravida, third trimester: Secondary | ICD-10-CM

## 2016-11-11 DIAGNOSIS — O0993 Supervision of high risk pregnancy, unspecified, third trimester: Secondary | ICD-10-CM

## 2016-11-11 DIAGNOSIS — O0990 Supervision of high risk pregnancy, unspecified, unspecified trimester: Secondary | ICD-10-CM

## 2016-11-11 DIAGNOSIS — Z98891 History of uterine scar from previous surgery: Secondary | ICD-10-CM

## 2016-11-11 NOTE — Progress Notes (Signed)
   PRENATAL VISIT NOTE  Subjective:  Renee [name] is a 41 y.o. [id number] at 5183w0d being seen today for ongoing prenatal care.  She is currently monitored for the following issues for this high-risk pregnancy and has Dental cavities; Supervision of high risk pregnancy, antepartum; H/O: C-section; AMA (advanced maternal age) multigravida 35+; Late prenatal care affecting pregnancy in second trimester; Cystic fibrosis gene carrier; and Cervix, short (affecting pregnancy) on her problem list.  Patient reports no complaints.  Contractions: Not present. Vag. Bleeding: None.  Movement: Present. Denies leaking of fluid.   The following portions of the patient's history were reviewed and updated as appropriate: allergies, current medications, past family history, past medical history, past social history, past surgical history and problem list. Problem list updated.  Objective:   Vitals:   11/11/16 0934  BP: 127/78  Pulse: 98  Weight: 206 lb 3.2 oz (93.5 kg)    Fetal Status: Fetal Heart Rate (bpm): 138 Fundal Height: 37 cm Movement: Present     General:  Alert, oriented and cooperative. Patient is in no acute distress.  Skin: Skin is warm and dry. No rash noted.   Cardiovascular: Normal heart rate noted  Respiratory: Normal respiratory effort, no problems with respiration noted  Abdomen: Soft, gravid, appropriate for gestational age.  Pain/Pressure: Present     Pelvic: Cervical exam deferred        Extremities: Normal range of motion.  Edema: None  Mental Status:  Normal mood and affect. Normal behavior. Normal judgment and thought content.   Assessment and Plan:  Pregnancy: [id number] at 5983w0d  1. Supervision of high risk pregnancy, antepartum Patient is doing well without complaints Prometrium discontinued  2. Elderly multigravida in third trimester   3. H/O: C-section Desires TOLAC and BTL  Term labor symptoms and general obstetric precautions including but not limited to vaginal  bleeding, contractions, leaking of fluid and fetal movement were reviewed in detail with the patient. Please refer to After Visit Summary for other counseling recommendations.  Return in about 1 week (around 11/18/2016) for ROB.   Catalina AntiguaPeggy Demaryius Imran, MD

## 2016-11-18 ENCOUNTER — Ambulatory Visit (INDEPENDENT_AMBULATORY_CARE_PROVIDER_SITE_OTHER): Payer: Medicaid Other | Admitting: Obstetrics and Gynecology

## 2016-11-18 VITALS — BP 118/71 | HR 88 | Wt 209.8 lb

## 2016-11-18 DIAGNOSIS — Z141 Cystic fibrosis carrier: Secondary | ICD-10-CM

## 2016-11-18 DIAGNOSIS — O0933 Supervision of pregnancy with insufficient antenatal care, third trimester: Secondary | ICD-10-CM

## 2016-11-18 DIAGNOSIS — O09523 Supervision of elderly multigravida, third trimester: Secondary | ICD-10-CM

## 2016-11-18 DIAGNOSIS — Z98891 History of uterine scar from previous surgery: Secondary | ICD-10-CM

## 2016-11-18 DIAGNOSIS — O0932 Supervision of pregnancy with insufficient antenatal care, second trimester: Secondary | ICD-10-CM

## 2016-11-18 DIAGNOSIS — O34219 Maternal care for unspecified type scar from previous cesarean delivery: Secondary | ICD-10-CM

## 2016-11-18 DIAGNOSIS — O0993 Supervision of high risk pregnancy, unspecified, third trimester: Secondary | ICD-10-CM

## 2016-11-18 DIAGNOSIS — O0990 Supervision of high risk pregnancy, unspecified, unspecified trimester: Secondary | ICD-10-CM

## 2016-11-18 NOTE — Progress Notes (Signed)
   PRENATAL VISIT NOTE  Subjective:  Renee [name] is a 41 y.o. [id number] at 9464w0d being seen today for ongoing prenatal care.  She is currently monitored for the following issues for this high-risk pregnancy and has Dental cavities; Supervision of high risk pregnancy, antepartum; H/O: C-section; AMA (advanced maternal age) multigravida 35+; Late prenatal care affecting pregnancy in second trimester; Cystic fibrosis gene carrier; and Cervix, short (affecting pregnancy) on her problem list.  Patient reports no complaints.  Contractions: Not present. Vag. Bleeding: None.  Movement: Present. Denies leaking of fluid.   The following portions of the patient's history were reviewed and updated as appropriate: allergies, current medications, past family history, past medical history, past social history, past surgical history and problem list. Problem list updated.  Objective:   Vitals:   11/18/16 1049  BP: 118/71  Pulse: 88  Weight: 209 lb 12.8 oz (95.2 kg)    Fetal Status: Fetal Heart Rate (bpm): 152 Fundal Height: 38 cm Movement: Present     General:  Alert, oriented and cooperative. Patient is in no acute distress.  Skin: Skin is warm and dry. No rash noted.   Cardiovascular: Normal heart rate noted  Respiratory: Normal respiratory effort, no problems with respiration noted  Abdomen: Soft, gravid, appropriate for gestational age.  Pain/Pressure: Present     Pelvic: Cervical exam deferred        Extremities: Normal range of motion.  Edema: Trace  Mental Status:  Normal mood and affect. Normal behavior. Normal judgment and thought content.   Assessment and Plan:  Pregnancy: [id number] at 164w0d  1. Supervision of high risk pregnancy, antepartum Patient is doing well with complaints of lower back pain and pelvic pressure with prolonged ambulation  2. Elderly multigravida in third trimester   3. H/O: C-section Patient plans for TOLAC and BTL  4. Late prenatal care affecting pregnancy in  second trimester   Term labor symptoms and general obstetric precautions including but not limited to vaginal bleeding, contractions, leaking of fluid and fetal movement were reviewed in detail with the patient. Please refer to After Visit Summary for other counseling recommendations.  Return in about 1 week (around 11/25/2016) for ROB.   Catalina AntiguaPeggy Analynn Daum, MD

## 2016-11-26 ENCOUNTER — Ambulatory Visit (INDEPENDENT_AMBULATORY_CARE_PROVIDER_SITE_OTHER): Payer: Medicaid Other | Admitting: Obstetrics & Gynecology

## 2016-11-26 ENCOUNTER — Encounter: Payer: Self-pay | Admitting: Obstetrics & Gynecology

## 2016-11-26 VITALS — BP 121/77 | HR 92 | Wt 210.0 lb

## 2016-11-26 DIAGNOSIS — O0990 Supervision of high risk pregnancy, unspecified, unspecified trimester: Secondary | ICD-10-CM

## 2016-11-26 DIAGNOSIS — O0993 Supervision of high risk pregnancy, unspecified, third trimester: Secondary | ICD-10-CM

## 2016-11-26 DIAGNOSIS — O09523 Supervision of elderly multigravida, third trimester: Secondary | ICD-10-CM

## 2016-11-26 NOTE — Progress Notes (Signed)
Patient has no problems today.

## 2016-11-26 NOTE — Progress Notes (Signed)
   PRENATAL VISIT NOTE  Subjective:  Renee [name] is a 41 y.o. [id number] at 8032w1d being seen today for ongoing prenatal care.  Renee [name] is currently monitored for the following issues for this low-risk pregnancy and has Dental cavities; Supervision of high risk pregnancy, antepartum; H/O: C-section; AMA (advanced maternal age) multigravida 35+; Late prenatal care affecting pregnancy in second trimester; Cystic fibrosis gene carrier; and Cervix, short (affecting pregnancy) on her problem list.  Patient reports no complaints.  Contractions: Not present. Vag. Bleeding: None.  Movement: Present. Denies leaking of fluid.   The following portions of the patient's history were reviewed and updated as appropriate: allergies, current medications, past family history, past medical history, past social history, past surgical history and problem list. Problem list updated.  Objective:   Vitals:   11/26/16 1005  BP: 121/77  Pulse: 92  Weight: 95.3 kg (210 lb)    Fetal Status: Fetal Heart Rate (bpm): 145   Movement: Present     General:  Alert, oriented and cooperative. Patient is in no acute distress.  Skin: Skin is warm and dry. No rash noted.   Cardiovascular: Normal heart rate noted  Respiratory: Normal respiratory effort, no problems with respiration noted  Abdomen: Soft, gravid, appropriate for gestational age.  Pain/Pressure: Absent     Pelvic: Cervical exam deferred        Extremities: Normal range of motion.  Edema: Trace  Mental Status:  Normal mood and affect. Normal behavior. Normal judgment and thought content.   Assessment and Plan:  Pregnancy: [id number] at 4732w1d  1. Supervision of high risk pregnancy, antepartum H/o macrosomia, cesarean delivery - US MFM OB FOLLOW UP; Future  Term labor symptoms and general obstetric precautions including but not limited to vaginal bleeding, contractions, leaking of fluid and fetal movement were reviewed in detail with the patient. Please refer to After  Visit Summary for other counseling recommendations.  Return in about 1 week (around 12/03/2016).   Scheryl DarterJames Kurt Azimi, MD

## 2016-11-26 NOTE — Patient Instructions (Signed)
Vaginal Birth After Cesarean Delivery Vaginal birth after cesarean delivery (VBAC) is giving birth vaginally after previously delivering a baby by a cesarean. In the past, if a woman had a cesarean delivery, all births afterward would be done by cesarean delivery. This is no longer true. It can be safe for the mother to try a vaginal delivery after having a cesarean delivery. It is important to discuss VBAC with your health care provider early in the pregnancy so you can understand the risks, benefits, and options. It will give you time to decide what is best in your particular case. The final decision about whether to have a VBAC or repeat cesarean delivery should be between you and your health care provider. Any changes in your health or your baby's health during your pregnancy may make it necessary to change your initial decision about VBAC. Women who plan to have a VBAC should check with their health care provider to be sure that:  The previous cesarean delivery was done with a low transverse uterine cut (incision) (not a vertical classical incision).  The birth canal is big enough for the baby.  There were no other operations on the uterus.  An electronic fetal monitor (EFM) will be on at all times during labor.  An operating room will be available and ready in case an emergency cesarean delivery is needed.  A health care provider and surgical nursing staff will be available at all times during labor to be ready to do an emergency delivery cesarean if necessary.  An anesthesiologist will be present in case an emergency cesarean delivery is needed.  The nursery is prepared and has adequate personnel and necessary equipment available to care for the baby in case of an emergency cesarean delivery. Benefits of VBAC  Shorter stay in the hospital.  Avoidance of risks associated with cesarean delivery, such as: ? Surgical complications, such as opening of the incision or hernia in the  incision. ? Injury to other organs. ? Fever. This can occur if an infection develops after surgery. It can also occur as a reaction to the medicine given to make you numb during the surgery.  Less blood loss and need for blood transfusions.  Lower risk of blood clots and infection.  Shorter recovery.  Decreased risk for having to remove the uterus (hysterectomy).  Decreased risk for the placenta to completely or partially cover the opening of the uterus (placenta previa) with a future pregnancy.  Decrease risk in future labor and delivery. Risks of a VBAC  Tearing (rupture) of the uterus. This is occurs in less than 1% of VBACs. The risk of this happening is higher if: ? Steps are taken to begin the labor process (induce labor) or stimulate or strengthen contractions (augment labor). ? Medicine is used to soften (ripen) the cervix.  Having to remove the uterus (hysterectomy) if it ruptures. VBAC should not be done if:  The previous cesarean delivery was done with a vertical (classical) or T-shaped incision or you do not know what kind of incision was made.  You had a ruptured uterus.  You have had certain types of surgery on your uterus, such as removal of uterine fibroids. Ask your health care provider about other types of surgeries that prevent you from having a VBAC.  You have certain medical or childbirth (obstetrical) problems.  There are problems with the baby.  You have had two previous cesarean deliveries and no vaginal deliveries. Other facts to know about VBAC:  It   is safe to have an epidural anesthetic with VBAC.  It is safe to turn the baby from a breech position (attempt an external cephalic version).  It is safe to try a VBAC with twins.  VBAC may not be successful if your baby weights 8.8 lb (4 kg) or more. However, weight predictions are not always accurate and should not be used alone to decide if VBAC is right for you.  There is an increased failure rate  if the time between the cesarean delivery and VBAC is less than 19 months.  Your health care provider may advise against a VBAC if you have preeclampsia (high blood pressure, protein in the urine, and swelling of face and extremities).  VBAC is often successful if you previously gave birth vaginally.  VBAC is often successful when the labor starts spontaneously before the due date.  Delivering a baby through a VBAC is similar to having a normal spontaneous vaginal delivery. This information is not intended to replace advice given to you by your health care provider. Make sure you discuss any questions you have with your health care provider. Document Released: 09/22/2006 Document Revised: 09/07/2015 Document Reviewed: 10/29/2012 Elsevier Interactive Patient Education  2018 [employer]vier [employer]  

## 2016-12-04 ENCOUNTER — Inpatient Hospital Stay (HOSPITAL_COMMUNITY)
Admission: AD | Admit: 2016-12-04 | Discharge: 2016-12-06 | DRG: 767 | Disposition: A | Discharge status: Home / Self Care | Payer: Medicaid Other | Source: Ambulatory Visit | Attending: Family Medicine | Admitting: Family Medicine

## 2016-12-04 ENCOUNTER — Inpatient Hospital Stay (HOSPITAL_COMMUNITY): Payer: Medicaid Other | Admitting: Anesthesiology

## 2016-12-04 ENCOUNTER — Encounter (HOSPITAL_COMMUNITY): Payer: Self-pay

## 2016-12-04 DIAGNOSIS — Z3A40 40 weeks gestation of pregnancy: Secondary | ICD-10-CM | POA: Diagnosis not present

## 2016-12-04 DIAGNOSIS — Z141 Cystic fibrosis carrier: Secondary | ICD-10-CM | POA: Diagnosis not present

## 2016-12-04 DIAGNOSIS — O0990 Supervision of high risk pregnancy, unspecified, unspecified trimester: Secondary | ICD-10-CM

## 2016-12-04 DIAGNOSIS — E669 Obesity, unspecified: Secondary | ICD-10-CM | POA: Diagnosis present

## 2016-12-04 DIAGNOSIS — Z302 Encounter for sterilization: Secondary | ICD-10-CM

## 2016-12-04 DIAGNOSIS — O34211 Maternal care for low transverse scar from previous cesarean delivery: Secondary | ICD-10-CM | POA: Diagnosis present

## 2016-12-04 DIAGNOSIS — O26873 Cervical shortening, third trimester: Secondary | ICD-10-CM | POA: Diagnosis present

## 2016-12-04 DIAGNOSIS — O99214 Obesity complicating childbirth: Secondary | ICD-10-CM | POA: Diagnosis present

## 2016-12-04 DIAGNOSIS — Z6838 Body mass index (BMI) 38.0-38.9, adult: Secondary | ICD-10-CM

## 2016-12-04 DIAGNOSIS — Z9851 Tubal ligation status: Secondary | ICD-10-CM

## 2016-12-04 LAB — CBC
HEMATOCRIT: 39 % (ref 36.0–46.0)
HEMOGLOBIN: 13.8 g/dL (ref 12.0–15.0)
MCH: 32.4 pg (ref 26.0–34.0)
MCHC: 35.4 g/dL (ref 30.0–36.0)
MCV: 91.5 fL (ref 78.0–100.0)
Platelets: 173 10*3/uL (ref 150–400)
RBC: 4.26 MIL/uL (ref 3.87–5.11)
RDW: 13.3 % (ref 11.5–15.5)
WBC: 13.3 10*3/uL — AB (ref 4.0–10.5)

## 2016-12-04 LAB — RPR: RPR Ser Ql: NONREACTIVE

## 2016-12-04 LAB — TYPE AND SCREEN
ABO/RH(D): O POS
ANTIBODY SCREEN: NEGATIVE

## 2016-12-04 MED ORDER — LACTATED RINGERS IV SOLN: 500.0000 mL | INTRAVENOUS | Status: DC | PRN

## 2016-12-04 MED ORDER — EPHEDRINE 5 MG/ML INJ
10.0000 mg | INTRAVENOUS | Status: DC | PRN
  Filled 2016-12-04: qty 2

## 2016-12-04 MED ORDER — FAMOTIDINE 20 MG PO TABS
40.0000 mg | ORAL_TABLET | Freq: Once | ORAL | Status: AC
  Administered 2016-12-05: 40 mg via ORAL
  Filled 2016-12-04: qty 2

## 2016-12-04 MED ORDER — DIBUCAINE 1 % RE OINT: 1.0000 "application " | TOPICAL_OINTMENT | RECTAL | Status: DC | PRN

## 2016-12-04 MED ORDER — OXYCODONE-ACETAMINOPHEN 5-325 MG PO TABS: 2.0000 | ORAL_TABLET | ORAL | Status: DC | PRN

## 2016-12-04 MED ORDER — BENZOCAINE-MENTHOL 20-0.5 % EX AERO
1.0000 "application " | INHALATION_SPRAY | CUTANEOUS | Status: DC | PRN
  Administered 2016-12-04: 1 via TOPICAL
  Filled 2016-12-04: qty 56

## 2016-12-04 MED ORDER — LIDOCAINE HCL (PF) 1 % IJ SOLN
INTRAMUSCULAR | Status: DC | PRN
  Administered 2016-12-04 (×2): 4 mL via EPIDURAL

## 2016-12-04 MED ORDER — ZOLPIDEM TARTRATE 5 MG PO TABS: 5.0000 mg | ORAL_TABLET | Freq: Every evening | ORAL | Status: DC | PRN

## 2016-12-04 MED ORDER — OXYCODONE-ACETAMINOPHEN 5-325 MG PO TABS: 1.0000 | ORAL_TABLET | ORAL | Status: DC | PRN

## 2016-12-04 MED ORDER — COCONUT OIL OIL: 1.0000 "application " | TOPICAL_OIL | Status: DC | PRN

## 2016-12-04 MED ORDER — TETANUS-DIPHTH-ACELL PERTUSSIS 5-2.5-18.5 LF-MCG/0.5 IM SUSP: 0.5000 mL | Freq: Once | INTRAMUSCULAR | Status: DC

## 2016-12-04 MED ORDER — WITCH HAZEL-GLYCERIN EX PADS: 1.0000 "application " | MEDICATED_PAD | CUTANEOUS | Status: DC | PRN

## 2016-12-04 MED ORDER — DIPHENHYDRAMINE HCL 50 MG/ML IJ SOLN: 12.5000 mg | INTRAMUSCULAR | Status: DC | PRN

## 2016-12-04 MED ORDER — PHENYLEPHRINE 40 MCG/ML (10ML) SYRINGE FOR IV PUSH (FOR BLOOD PRESSURE SUPPORT)
80.0000 ug | PREFILLED_SYRINGE | INTRAVENOUS | Status: DC | PRN
  Filled 2016-12-04: qty 10
  Filled 2016-12-04: qty 5

## 2016-12-04 MED ORDER — IBUPROFEN 600 MG PO TABS
600.0000 mg | ORAL_TABLET | Freq: Four times a day (QID) | ORAL | Status: DC
  Administered 2016-12-04 – 2016-12-06 (×7): 600 mg via ORAL
  Filled 2016-12-04 (×7): qty 1

## 2016-12-04 MED ORDER — SIMETHICONE 80 MG PO CHEW: 80.0000 mg | CHEWABLE_TABLET | ORAL | Status: DC | PRN

## 2016-12-04 MED ORDER — OXYTOCIN 40 UNITS IN LACTATED RINGERS INFUSION - SIMPLE MED
1.0000 m[IU]/min | INTRAVENOUS | Status: DC
  Administered 2016-12-04: 2 m[IU]/min via INTRAVENOUS
  Filled 2016-12-04: qty 1000

## 2016-12-04 MED ORDER — PHENYLEPHRINE 40 MCG/ML (10ML) SYRINGE FOR IV PUSH (FOR BLOOD PRESSURE SUPPORT)
80.0000 ug | PREFILLED_SYRINGE | INTRAVENOUS | Status: DC | PRN
  Filled 2016-12-04: qty 5

## 2016-12-04 MED ORDER — ONDANSETRON HCL 4 MG/2ML IJ SOLN: 4.0000 mg | Freq: Four times a day (QID) | INTRAMUSCULAR | Status: DC | PRN

## 2016-12-04 MED ORDER — FLEET ENEMA 7-19 GM/118ML RE ENEM: 1.0000 | ENEMA | RECTAL | Status: DC | PRN

## 2016-12-04 MED ORDER — METOCLOPRAMIDE HCL 10 MG PO TABS
10.0000 mg | ORAL_TABLET | Freq: Once | ORAL | Status: AC
  Administered 2016-12-05: 10 mg via ORAL
  Filled 2016-12-04: qty 1

## 2016-12-04 MED ORDER — LACTATED RINGERS IV SOLN
500.0000 mL | Freq: Once | INTRAVENOUS | Status: AC
  Administered 2016-12-04: 500 mL via INTRAVENOUS

## 2016-12-04 MED ORDER — DIPHENHYDRAMINE HCL 25 MG PO CAPS: 25.0000 mg | ORAL_CAPSULE | Freq: Four times a day (QID) | ORAL | Status: DC | PRN

## 2016-12-04 MED ORDER — ONDANSETRON HCL 4 MG PO TABS: 4.0000 mg | ORAL_TABLET | ORAL | Status: DC | PRN

## 2016-12-04 MED ORDER — SENNOSIDES-DOCUSATE SODIUM 8.6-50 MG PO TABS
2.0000 | ORAL_TABLET | ORAL | Status: DC
  Administered 2016-12-04 – 2016-12-05 (×2): 2 via ORAL
  Filled 2016-12-04 (×2): qty 2

## 2016-12-04 MED ORDER — PRENATAL MULTIVITAMIN CH
1.0000 | ORAL_TABLET | Freq: Every day | ORAL | Status: DC
  Administered 2016-12-05: 1 via ORAL
  Filled 2016-12-04: qty 1

## 2016-12-04 MED ORDER — LACTATED RINGERS IV SOLN
INTRAVENOUS | Status: DC
  Administered 2016-12-05: 10:00:00 via INTRAVENOUS
  Administered 2016-12-05: 20 mL via INTRAVENOUS

## 2016-12-04 MED ORDER — ACETAMINOPHEN 325 MG PO TABS
650.0000 mg | ORAL_TABLET | ORAL | Status: DC | PRN
  Administered 2016-12-05: 650 mg via ORAL
  Filled 2016-12-04: qty 2

## 2016-12-04 MED ORDER — SOD CITRATE-CITRIC ACID 500-334 MG/5ML PO SOLN: 30.0000 mL | ORAL | Status: DC | PRN

## 2016-12-04 MED ORDER — FENTANYL 2.5 MCG/ML BUPIVACAINE 1/10 % EPIDURAL INFUSION (WH - ANES)
14.0000 mL/h | INTRAMUSCULAR | Status: DC | PRN
  Administered 2016-12-04: 12.5 mL/h via EPIDURAL
  Filled 2016-12-04: qty 100

## 2016-12-04 MED ORDER — LACTATED RINGERS IV SOLN
INTRAVENOUS | Status: DC
  Administered 2016-12-04 (×2): via INTRAVENOUS

## 2016-12-04 MED ORDER — FENTANYL CITRATE (PF) 100 MCG/2ML IJ SOLN
100.0000 ug | INTRAMUSCULAR | Status: DC | PRN
  Administered 2016-12-04: 100 ug via INTRAVENOUS
  Filled 2016-12-04: qty 2

## 2016-12-04 MED ORDER — OXYTOCIN 40 UNITS IN LACTATED RINGERS INFUSION - SIMPLE MED: 2.5000 [IU]/h | INTRAVENOUS | Status: DC

## 2016-12-04 MED ORDER — ACETAMINOPHEN 325 MG PO TABS: 650.0000 mg | ORAL_TABLET | ORAL | Status: DC | PRN

## 2016-12-04 MED ORDER — LIDOCAINE HCL (PF) 1 % IJ SOLN
30.0000 mL | INTRAMUSCULAR | Status: DC | PRN
  Administered 2016-12-04: 30 mL via SUBCUTANEOUS
  Filled 2016-12-04: qty 30

## 2016-12-04 MED ORDER — OXYTOCIN BOLUS FROM INFUSION
500.0000 mL | Freq: Once | INTRAVENOUS | Status: AC
  Administered 2016-12-04: 500 mL via INTRAVENOUS

## 2016-12-04 MED ORDER — ONDANSETRON HCL 4 MG/2ML IJ SOLN: 4.0000 mg | INTRAMUSCULAR | Status: DC | PRN

## 2016-12-04 MED ORDER — TERBUTALINE SULFATE 1 MG/ML IJ SOLN
0.2500 mg | Freq: Once | INTRAMUSCULAR | Status: DC | PRN
  Filled 2016-12-04: qty 1

## 2016-12-04 NOTE — Progress Notes (Addendum)
Labor Progress Note  S: Patient seen & examined for progress of labor. Patient was hesitant about starting pitocin because it would increase her pain.  I assured her that she can always request an epidural or IV pain medications for pain relief, and she was agreeable to starting pitocin.  Patient was moderately uncomfortable with contractions but now receiving epidural.   O: BP 112/70   Pulse 80   Temp 97.7 F (36.5 C) (Oral)   Resp (!) 22   Ht 5\' 2"  (1.575 m)   Wt 95.3 kg (210 lb)   LMP 02/22/2016   BMI 38.41 kg/m   FHT: 130bpm, mod var, +accels, no decels TOCO: q3-45min, patient looks uncomfortable during contractions  CVE: Dilation: 7 Effacement (%): 90 Cervical Position: Posterior Station: -2 Presentation: Vertex Exam by:: J.Thornton< RN   A&P: 41 y.o. [id number] [redacted]w[redacted]d here for SROM/SOL.  Currently on 30milli-units of pitocin Continue augmentation Anticipate SVD  Lezlie Octave, MD [facility] Resident PGY-1 12/04/2016 11:09 AM

## 2016-12-04 NOTE — Lactation Note (Signed)
This note was copied from a baby's chart. Lactation Consultation Note  Patient Name: Renee [name] [id number] Date: 12/04/2016 Reason for consult: Initial assessment Baby at 2 hr of life. Mom reports baby latched well. She denies breast or nipple pain. She voiced no concerns. Mom was trying to order dinner and requested that lactation come back later. Left handouts.   Maternal Data    Feeding Feeding Type: Breast Fed Length of feed: 45 min  LATCH Score Latch: Grasps breast easily, tongue down, lips flanged, rhythmical sucking.  Audible Swallowing: Spontaneous and intermittent  Type of Nipple: Everted at rest and after stimulation  Comfort (Breast/Nipple): Soft / non-tender  Hold (Positioning): No assistance needed to correctly position infant at breast.  LATCH Score: 10  Interventions    Lactation Tools Discussed/Used     Consult Status Consult Status: Follow-up Date: 12/05/16 Follow-up type: In-patient    Rulon Eisenmenger 12/04/2016, 5:50 PM

## 2016-12-04 NOTE — Anesthesia Procedure Notes (Signed)
Epidural Patient location during procedure: OB Start time: 12/04/2016 11:01 AM  Staffing Anesthesiologist: Mal Amabile Performed: anesthesiologist   Preanesthetic Checklist Completed: patient identified, site marked, surgical consent, pre-op evaluation, timeout performed, IV checked, risks and benefits discussed and monitors and equipment checked  Epidural Patient position: sitting Prep: site prepped and draped and DuraPrep Patient monitoring: continuous pulse ox and blood pressure Approach: midline Location: L3-L4 Injection technique: LOR air  Needle:  Needle type: Tuohy  Needle gauge: 17 G Needle length: 9 cm and 9 Needle insertion depth: 5 cm cm Catheter type: closed end flexible Catheter size: 19 Gauge Catheter at skin depth: 10 cm Test dose: negative and Other  Assessment Events: blood not aspirated, injection not painful, no injection resistance, negative IV test and no paresthesia  Additional Notes Patient identified. Risks and benefits discussed including failed block, incomplete  Pain control, post dural puncture headache, nerve damage, paralysis, blood pressure Changes, nausea, vomiting, reactions to medications-both toxic and allergic and post Partum back pain. All questions were answered. Patient expressed understanding and wished to proceed. Sterile technique was used throughout procedure. Epidural site was Dressed with sterile barrier dressing. No paresthesias, signs of intravascular injection Or signs of intrathecal spread were encountered.  Patient was more comfortable after the epidural was dosed. Please see RN's note for documentation of vital signs and FHR which are stable.

## 2016-12-04 NOTE — Anesthesia Pain Management Evaluation Note (Signed)
  CRNA Pain Management Visit Note  Patient: Renee [name], 41 y.o., female  "Hello I am a member of the anesthesia team at [facility]. We have an anesthesia team available at all times to provide care throughout the hospital, including epidural management and anesthesia for C-section. I don't know your plan for the delivery whether it a natural birth, water birth, IV sedation, nitrous supplementation, doula or epidural, but we want to meet your pain goals."   1.Was your pain managed to your expectations on prior hospitalizations?   Yes   2.What is your expectation for pain management during this hospitalization?     Epidural  3.How can we help you reach that goal? Epidural when ready.  Record the patient's initial score and the patient's pain goal.   Pain: 10--patient states that this is more than she wants to feel but does not want to ask for pain medications at this time. I informed patient to ask for pain control if she doesn't want to feel this much pain. Patient is aware of all pain control options. L&D RN in room.  Pain Goal: 10 [facility] wants you to be able to say your pain was always managed very well.  Ahlam Piscitelli L 12/04/2016

## 2016-12-04 NOTE — Anesthesia Preprocedure Evaluation (Signed)
Anesthesia Evaluation  Patient identified by MRN, date of birth, ID band Patient awake    Reviewed: Allergy & Precautions, NPO status , Patient's Chart, lab work & pertinent test results  Airway Mallampati: III  TM Distance: >3 FB Neck ROM: Full    Dental no notable dental hx. (+) Teeth Intact   Pulmonary neg pulmonary ROS,    Pulmonary exam normal breath sounds clear to auscultation       Cardiovascular negative cardio ROS Normal cardiovascular exam Rhythm:Regular Rate:Normal     Neuro/Psych negative neurological ROS  negative psych ROS   GI/Hepatic negative GI ROS, Neg liver ROS,   Endo/Other  Obesity  Renal/GU negative Renal ROS  negative genitourinary   Musculoskeletal negative musculoskeletal ROS (+)   Abdominal (+) + obese,   Peds  Hematology negative hematology ROS (+)   Anesthesia Other Findings   Reproductive/Obstetrics (+) Pregnancy Previous C/section Attempting TOLAC and VBAC                             Anesthesia Physical Anesthesia Plan  ASA: II  Anesthesia Plan: Epidural   Post-op Pain Management:    Induction:   PONV Risk Score and Plan:   Airway Management Planned: Natural Airway  Additional Equipment:   Intra-op Plan:   Post-operative Plan:   Informed Consent: I have reviewed the patients History and Physical, chart, labs and discussed the procedure including the risks, benefits and alternatives for the proposed anesthesia with the patient or authorized representative who has indicated his/her understanding and acceptance.     Plan Discussed with: Anesthesiologist  Anesthesia Plan Comments:         Anesthesia Quick Evaluation

## 2016-12-04 NOTE — H&P (Signed)
LABOR AND DELIVERY ADMISSION HISTORY AND PHYSICAL NOTE  Renee [name] is a 41 y.o. female [phone] with IUP at [redacted]w[redacted]d by 19-wk U/S, and pregnancy complicated by h/o prior C/S, AMA, late PNC, short cervix, and CF carrier, presenting for SROM/SOL. Reports ROM at 2 am this morning with clear fluid. Having regular contractions  She reports positive fetal movement.  Prenatal History/Complications: PNC at [facility].  Complications: Hx of C/S (for macrosomia), AMA, late PNC, short cervix, CF carrier  Past Medical History: History reviewed. No pertinent past medical history.  Past Surgical History: Past Surgical History:  Procedure Laterality Date  . CESAREAN SECTION  01/03/2011   Procedure: CESAREAN SECTION;  Surgeon: Roseanna Rainbow, MD;  Location: [facility];  Service: Gynecology;  Laterality: N/A;  . CHOLECYSTECTOMY  01/2008    Obstetrical History: OB History    Gravida Para Term Preterm AB Living   4 2 2   1 2    SAB TAB Ectopic Multiple Live Births   1       2      Social History: Social History   Social History  . Marital status: Married    Spouse name: N/A  . Number of children: N/A  . Years of education: N/A   Social History Main Topics  . Smoking status: Never Smoker  . Smokeless tobacco: Never Used  . Alcohol use No  . Drug use: No  . Sexual activity: Yes    Birth control/ protection: None   Other Topics Concern  . None   Social History Narrative  . None    Family History: No family history on file.  Allergies: No Known Allergies  Prescriptions Prior to Admission  Medication Sig Dispense Refill Last Dose  . acetaminophen (TYLENOL) 325 MG tablet Take 650 mg by mouth every 6 (six) hours as needed for moderate pain.   Taking  . Elastic Bandages & Supports (COMFORT FIT MATERNITY SUPP LG) MISC 1 Units by Does not apply route daily. 1 each 0 Taking  . famotidine (PEPCID) 20 MG tablet Take 1 tablet (20 mg total) by mouth 2 (two) times daily. 60 tablet 3 Taking  .  Prenatal Vit-Fe Fumarate-FA (MULTIVITAMIN-PRENATAL) 27-0.8 MG TABS tablet Take 1 tablet by mouth daily at 12 noon.   Taking     Review of Systems  All systems reviewed and negative except as stated in HPI  Physical Exam Blood pressure 117/74, pulse 72, temperature (!) 97.5 F (36.4 C), temperature source Oral, resp. rate 20, height 5\' 2"  (1.575 m), weight 210 lb (95.3 kg), last menstrual period 02/22/2016. General appearance: alert and cooperative, NAD Lungs: normal WOB; no respiratory distress Heart: normal rate Abdomen: soft, non-tender, gravid Extremities: No calf swelling or tenderness Presentation: cephalic Fetal monitoring: baseline rate 145, moderate variability, + acel, no decel Uterine activity: ctx  q 1-3 min SVE: Dilation: 4 Effacement (%): 80 Station: -3 Exam by:: A Gagliardo RN   Prenatal labs: ABO, Rh: O/Positive/-- (03/09 1223) Antibody: Negative (03/09 1223) Rubella: Immune RPR: Non Reactive (05/30 1040)  HBsAg: Negative (03/09 1223)  HIV:   Negative GBS: Negative (07/24 1132)  2-hr GTT: 86, 179, 149 -- normal Genetic screening:  NIPS normal Anatomy US: normal, female fetus  Prenatal Transfer Tool  Maternal Diabetes: No Genetic Screening: Normal Maternal Ultrasounds/Referrals: Normal Fetal Ultrasounds or other Referrals:  None Maternal Substance Abuse:  No Significant Maternal Medications:  None Significant Maternal Lab Results: Lab values include: Other: CF carrier  Results for orders placed or  performed during the hospital encounter of 12/04/16 (from the past 24 hour(s))  CBC   Collection Time: 12/04/16  3:05 AM  Result Value Ref Range   WBC 13.3 (H) 4.0 - 10.5 K/uL   RBC 4.26 3.87 - 5.11 MIL/uL   Hemoglobin 13.8 12.0 - 15.0 g/dL   HCT 16.1 09.6 - 04.5 %   MCV 91.5 78.0 - 100.0 fL   MCH 32.4 26.0 - 34.0 pg   MCHC 35.4 30.0 - 36.0 g/dL   RDW 40.9 81.1 - 91.4 %   Platelets 173 150 - 400 K/uL  Type and screen [facility]    Collection Time: 12/04/16  3:05 AM  Result Value Ref Range   ABO/RH(D) O POS    Antibody Screen NEG    Sample Expiration 12/07/2016     Patient Active Problem List   Diagnosis Date Noted  . Cervix, short (affecting pregnancy) 10/09/2016  . 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 H/O: C-section 06/21/2016  . AMA (advanced maternal age) multigravida 35+ 06/21/2016  . Late prenatal care affecting pregnancy in second trimester 06/21/2016  . Supervision of high risk pregnancy, antepartum 06/20/2016  . Dental cavities 07/21/2013    Assessment: Renee [name] is a 41 y.o. [id number] at [redacted]w[redacted]d here for SOL with SROM at 2am. Hx of C/S for fetal macrosomia (9lb12oz) with second pregnancy; first pregnancy SVD (8lb12oz). Pt would like TOLAC (VBAC consent in the chart).  #Labor: Expectant management #Pain: May have epidural per pt request #FWB: Cat I #ID:  GBS negative #MOF: breast #MOC: BTL (consent signed 10/09/16) #Circ:  outpatient  Kandra Nicolas Ola Fawver 12/04/2016, 3:12 AM

## 2016-12-04 NOTE — MAU Note (Signed)
Pt here with c/o contractions since about 0200, ROM at 0200, clear fluid. Reports good fetal movement.

## 2016-12-04 NOTE — Progress Notes (Signed)
Labor Progress Note  S: Patient seen & examined for progress of labor. Patient very uncomfortable with feeling of pressure.  Forebag was artificially ruptured during exam.  O: BP 111/63   Pulse 79   Temp 98 F (36.7 C) (Oral)   Resp 18   Ht 5\' 2"  (1.575 m)   Wt 95.3 kg (210 lb)   LMP 02/22/2016   BMI 38.41 kg/m   FHT: 130bpm, mod var, +accels, no decels TOCO: q2min, patient looks uncomfortable during contractions  CVE: Dilation: 8 Effacement (%): 90 Cervical Position: Posterior Station: -2 Presentation: Vertex Exam by::  (Dr. Frances Furbish)  A&P: 41 y.o. [id number] [redacted]w[redacted]d here for SROM/SOL.  Currently on 44milli-units of pitocin Continue augmentation Anticipate SVD Patient was advised to notify nurse when she feels need to push.  Lezlie Octave, MD [facility] Resident PGY-1 12/04/2016 2:07 PM

## 2016-12-04 NOTE — Anesthesia Postprocedure Evaluation (Signed)
Anesthesia Post Note  Patient: Renee [name]  Procedure(s) Performed: * No procedures listed *     Patient location during evaluation: Mother Baby Anesthesia Type: Epidural Level of consciousness: awake and alert and oriented Pain management: pain level controlled Vital Signs Assessment: post-procedure vital signs reviewed and stable Respiratory status: spontaneous breathing and nonlabored ventilation Cardiovascular status: stable Postop Assessment: no headache, patient able to bend at knees, no backache, no signs of nausea or vomiting, epidural receding and adequate PO intake Anesthetic complications: no    Last Vitals:  Vitals:   12/04/16 1647 12/04/16 1740  BP: (!) 106/55 115/60  Pulse: 80 74  Resp: 18 20  Temp:  36.6 C    Last Pain:  Vitals:   12/04/16 1740  TempSrc: Oral  PainSc:    Pain Goal: Patients Stated Pain Goal: Other (Comment) (Pt does not want pain medication at this time) (12/04/16 0502)               Laban Emperor

## 2016-12-04 NOTE — Progress Notes (Signed)
Pt requesting not to start pitocin at this time--MD made aware of pt wishes and will come evaluate and discuss options with pt

## 2016-12-05 ENCOUNTER — Encounter (HOSPITAL_COMMUNITY): Payer: Self-pay

## 2016-12-05 ENCOUNTER — Ambulatory Visit (HOSPITAL_COMMUNITY): Payer: Medicaid Other

## 2016-12-05 ENCOUNTER — Inpatient Hospital Stay (HOSPITAL_COMMUNITY): Payer: Medicaid Other | Admitting: Anesthesiology

## 2016-12-05 ENCOUNTER — Encounter: Payer: Medicaid Other | Admitting: Certified Nurse Midwife

## 2016-12-05 ENCOUNTER — Encounter (HOSPITAL_COMMUNITY)
Admission: AD | Disposition: A | Discharge status: Home / Self Care | Payer: Self-pay | Source: Ambulatory Visit | Attending: Family Medicine

## 2016-12-05 DIAGNOSIS — Z302 Encounter for sterilization: Secondary | ICD-10-CM

## 2016-12-05 HISTORY — PX: TUBAL LIGATION: SHX77

## 2016-12-05 LAB — CBC
HEMATOCRIT: 35.1 % — AB (ref 36.0–46.0)
Hemoglobin: 12.4 g/dL (ref 12.0–15.0)
MCH: 33 pg (ref 26.0–34.0)
MCHC: 35.3 g/dL (ref 30.0–36.0)
MCV: 93.4 fL (ref 78.0–100.0)
PLATELETS: 146 10*3/uL — AB (ref 150–400)
RBC: 3.76 MIL/uL — ABNORMAL LOW (ref 3.87–5.11)
RDW: 13.2 % (ref 11.5–15.5)
WBC: 16.8 10*3/uL — ABNORMAL HIGH (ref 4.0–10.5)

## 2016-12-05 SURGERY — LIGATION, FALLOPIAN TUBE, POSTPARTUM
Anesthesia: Epidural | Laterality: Bilateral

## 2016-12-05 MED ORDER — BUPIVACAINE HCL (PF) 0.5 % IJ SOLN
INTRAMUSCULAR | Status: AC
  Filled 2016-12-05: qty 30

## 2016-12-05 MED ORDER — HYDROMORPHONE HCL 1 MG/ML IJ SOLN
INTRAMUSCULAR | Status: AC
  Filled 2016-12-05: qty 1

## 2016-12-05 MED ORDER — FENTANYL CITRATE (PF) 100 MCG/2ML IJ SOLN
INTRAMUSCULAR | Status: AC
  Filled 2016-12-05: qty 2

## 2016-12-05 MED ORDER — PROMETHAZINE HCL 25 MG/ML IJ SOLN: 6.2500 mg | INTRAMUSCULAR | Status: DC | PRN

## 2016-12-05 MED ORDER — FENTANYL CITRATE (PF) 100 MCG/2ML IJ SOLN
INTRAMUSCULAR | Status: DC | PRN
  Administered 2016-12-05: 100 ug via INTRAVENOUS

## 2016-12-05 MED ORDER — MIDAZOLAM HCL 2 MG/2ML IJ SOLN
INTRAMUSCULAR | Status: DC | PRN
Start: 2016-12-05 — End: 2016-12-05
  Administered 2016-12-05 (×2): 1 mg via INTRAVENOUS

## 2016-12-05 MED ORDER — BUPIVACAINE HCL (PF) 0.25 % IJ SOLN
INTRAMUSCULAR | Status: DC | PRN
  Administered 2016-12-05: 10 mL
  Administered 2016-12-05: 6 mL

## 2016-12-05 MED ORDER — PROPOFOL 10 MG/ML IV BOLUS
INTRAVENOUS | Status: AC
  Filled 2016-12-05: qty 20

## 2016-12-05 MED ORDER — OXYCODONE HCL 5 MG PO TABS: 5.0000 mg | ORAL_TABLET | Freq: Once | ORAL | Status: DC | PRN

## 2016-12-05 MED ORDER — OXYCODONE HCL 5 MG/5ML PO SOLN: 5.0000 mg | Freq: Once | ORAL | Status: DC | PRN

## 2016-12-05 MED ORDER — OXYCODONE HCL 5 MG PO TABS
5.0000 mg | ORAL_TABLET | ORAL | Status: DC | PRN
  Administered 2016-12-05 – 2016-12-06 (×3): 5 mg via ORAL
  Filled 2016-12-05 (×3): qty 1

## 2016-12-05 MED ORDER — LIDOCAINE-EPINEPHRINE (PF) 2 %-1:200000 IJ SOLN
INTRAMUSCULAR | Status: AC
  Filled 2016-12-05: qty 20

## 2016-12-05 MED ORDER — MIDAZOLAM HCL 2 MG/2ML IJ SOLN
INTRAMUSCULAR | Status: AC
  Filled 2016-12-05: qty 2

## 2016-12-05 MED ORDER — SODIUM BICARBONATE 8.4 % IV SOLN
INTRAVENOUS | Status: AC
  Filled 2016-12-05: qty 50

## 2016-12-05 MED ORDER — LIDOCAINE HCL (CARDIAC) 20 MG/ML IV SOLN
INTRAVENOUS | Status: AC
  Filled 2016-12-05: qty 5

## 2016-12-05 MED ORDER — SODIUM BICARBONATE 8.4 % IV SOLN
INTRAVENOUS | Status: DC | PRN
  Administered 2016-12-05 (×4): 5 mL via EPIDURAL

## 2016-12-05 MED ORDER — ONDANSETRON HCL 4 MG/2ML IJ SOLN
INTRAMUSCULAR | Status: AC
  Filled 2016-12-05: qty 2

## 2016-12-05 MED ORDER — HYDROMORPHONE HCL 1 MG/ML IJ SOLN
0.2500 mg | INTRAMUSCULAR | Status: DC | PRN
  Administered 2016-12-05: 0.5 mg via INTRAVENOUS

## 2016-12-05 SURGICAL SUPPLY — 21 items
BLADE SURG 11 STRL SS (BLADE) ×3 IMPLANT
CLIP FILSHIE TUBAL LIGA STRL (Clip) ×3 IMPLANT
CLOTH BEACON ORANGE TIMEOUT ST (SAFETY) ×3 IMPLANT
DRSG OPSITE POSTOP 3X4 (GAUZE/BANDAGES/DRESSINGS) ×3 IMPLANT
DURAPREP 26ML APPLICATOR (WOUND CARE) ×3 IMPLANT
GLOVE BIO SURGEON STRL SZ 6.5 (GLOVE) ×2 IMPLANT
GLOVE BIO SURGEONS STRL SZ 6.5 (GLOVE) ×1
GLOVE BIOGEL PI IND STRL 7.0 (GLOVE) ×2 IMPLANT
GLOVE BIOGEL PI INDICATOR 7.0 (GLOVE) ×4
GOWN STRL REUS W/TWL LRG LVL3 (GOWN DISPOSABLE) ×6 IMPLANT
NEEDLE HYPO 22GX1.5 SAFETY (NEEDLE) ×3 IMPLANT
NS IRRIG 1000ML POUR BTL (IV SOLUTION) ×3 IMPLANT
PACK ABDOMINAL MINOR (CUSTOM PROCEDURE TRAY) ×3 IMPLANT
PROTECTOR NERVE ULNAR (MISCELLANEOUS) ×3 IMPLANT
SPONGE LAP 4X18 X RAY DECT (DISPOSABLE) ×3 IMPLANT
SUT VIC AB 0 CT1 27 (SUTURE) ×2
SUT VIC AB 0 CT1 27XBRD ANBCTR (SUTURE) ×1 IMPLANT
SUT VICRYL 4-0 PS2 18IN ABS (SUTURE) ×3 IMPLANT
SYR CONTROL 10ML LL (SYRINGE) ×3 IMPLANT
TOWEL OR 17X24 6PK STRL BLUE (TOWEL DISPOSABLE) ×6 IMPLANT
TRAY FOLEY CATH SILVER 16FR (SET/KITS/TRAYS/PACK) ×3 IMPLANT

## 2016-12-05 NOTE — Progress Notes (Signed)
Post Partum Day 1 Subjective: no complaints  Objective: Blood pressure 104/72, pulse 79, temperature (!) 97.4 F (36.3 C), temperature source Oral, resp. rate 18, height 5\' 2"  (1.575 m), weight 210 lb (95.3 kg), last menstrual period 02/22/2016, SpO2 99 %, unknown if currently breastfeeding.  Physical Exam:  General: alert, cooperative and appears stated age Lochia: appropriate Uterine Fundus: firm DVT Evaluation: No evidence of DVT seen on physical exam.   Recent Labs  12/04/16 0305 12/05/16 0536  HGB 13.8 12.4  HCT 39.0 35.1*    Assessment/Plan: Plan for discharge tomorrow, Breastfeeding and Contraception for ppBTL today   LOS: 1 day   [name] 12/05/2016, 8:56 AM

## 2016-12-05 NOTE — Anesthesia Preprocedure Evaluation (Signed)
Anesthesia Evaluation  Patient identified by MRN, date of birth, ID band Patient awake    Reviewed: Allergy & Precautions, NPO status , Patient's Chart, lab work & pertinent test results  Airway Mallampati: III  TM Distance: >3 FB Neck ROM: Full    Dental no notable dental hx. (+) Teeth Intact   Pulmonary neg pulmonary ROS,    Pulmonary exam normal breath sounds clear to auscultation       Cardiovascular negative cardio ROS Normal cardiovascular exam Rhythm:Regular Rate:Normal     Neuro/Psych negative neurological ROS  negative psych ROS   GI/Hepatic negative GI ROS, Neg liver ROS,   Endo/Other  Obesity  Renal/GU negative Renal ROS  negative genitourinary   Musculoskeletal negative musculoskeletal ROS (+)   Abdominal (+) + obese,   Peds  Hematology negative hematology ROS (+)   Anesthesia Other Findings   Reproductive/Obstetrics (+) Pregnancy Previous C/section Attempting TOLAC and VBAC                             Anesthesia Physical  Anesthesia Plan  ASA: II  Anesthesia Plan: Epidural   Post-op Pain Management:    Induction:   PONV Risk Score and Plan: 2 and Ondansetron and Midazolam  Airway Management Planned: Simple Face Mask  Additional Equipment:   Intra-op Plan:   Post-operative Plan:   Informed Consent: I have reviewed the patients History and Physical, chart, labs and discussed the procedure including the risks, benefits and alternatives for the proposed anesthesia with the patient or authorized representative who has indicated his/her understanding and acceptance.     Plan Discussed with: Anesthesiologist  Anesthesia Plan Comments:         Anesthesia Quick Evaluation

## 2016-12-05 NOTE — Lactation Note (Signed)
This note was copied from a baby's chart. Lactation Consultation Note  Patient Name: Renee [name] GOVPC'H Date: 12/05/2016  Mom states baby is feeding well.  No concerns or questions this morning.  Instructed to feed with feeding cues and call for assist prn.   Maternal Data    Feeding    LATCH Score                   Interventions    Lactation Tools Discussed/Used     Consult Status      Huston Foley 12/05/2016, 12:32 PM

## 2016-12-05 NOTE — Transfer of Care (Signed)
Immediate Anesthesia Transfer of Care Note  Patient: Renee [name]  Procedure(s) Performed: Procedure(s): POST PARTUM TUBAL LIGATION (Bilateral)  Patient Location: PACU  Anesthesia Type:Epidural  Level of Consciousness: sedated  Airway & Oxygen Therapy: Patient Spontanous Breathing  Post-op Assessment: Report given to RN  Post vital signs: Reviewed and unstable  Last Vitals:  Vitals:   12/05/16 0530 12/05/16 0814  BP: 108/60 104/72  Pulse: 84 79  Resp: 18 18  Temp: 36.8 C (!) 36.3 C  SpO2: 97% 99%    Last Pain:  Vitals:   12/05/16 0815  TempSrc:   PainSc: 3       Patients Stated Pain Goal: Other (Comment) (Pt does not want pain medication at this time) (12/04/16 0502)  Complications: No apparent anesthesia complications

## 2016-12-05 NOTE — Interval H&P Note (Signed)
History and Physical Interval Note:  12/05/2016 8:55 AM  Renee [name]  has presented today for surgery, now ppd #1 s/p VBAC with the diagnosis of Desires Sterilization  The various methods of treatment have been discussed with the patient and family. After consideration of risks, benefits and other options for treatment, the patient has consented to  Procedure(s): POST PARTUM TUBAL LIGATION (Bilateral) as a surgical intervention .  The patient's history has been reviewed, patient examined, no change in status, stable for surgery.  I have reviewed the patient's chart and labs. Patient counseled, r.e. Risks benefits of BTL, including permanency of procedure, risk of failure(1:100), increased risk of ectopic.  Patient verbalized understanding and desires to proceed  Questions were answered to the patient's satisfaction.     [name]

## 2016-12-05 NOTE — Anesthesia Postprocedure Evaluation (Signed)
Anesthesia Post Note  Patient: Renee [name]  Procedure(s) Performed: Procedure(s) (LRB): POST PARTUM TUBAL LIGATION (Bilateral)     Patient location during evaluation: Mother Baby Anesthesia Type: Epidural Level of consciousness: awake and alert Pain management: pain level controlled Vital Signs Assessment: post-procedure vital signs reviewed and stable Respiratory status: spontaneous breathing, nonlabored ventilation and respiratory function stable Cardiovascular status: stable Postop Assessment: no headache, no backache and epidural receding Anesthetic complications: no    Last Vitals:  Vitals:   12/05/16 1130 12/05/16 1148  BP: 119/79 112/70  Pulse: 93 70  Resp: 16 16  Temp: 36.6 C 36.6 C  SpO2: 100% 98%    Last Pain:  Vitals:   12/05/16 1150  TempSrc:   PainSc: 4    Pain Goal: Patients Stated Pain Goal: 4 (12/05/16 1030)               Lowella Curb

## 2016-12-05 NOTE — Op Note (Signed)
Renee [name]  12/04/2016 - 12/05/2016  PREOPERATIVE DIAGNOSIS:  Multiparity, undesired fertility  POSTOPERATIVE DIAGNOSIS:  Multiparity, undesired fertility  PROCEDURE:  Postpartum Bilateral Tubal Sterilization using Filshie Clips   ANESTHESIA:  Epidural and local analgesia using 0.5% Marcaine  COMPLICATIONS:  None immediate.  ESTIMATED BLOOD LOSS: 5 ml.  INDICATIONS: 41 y.o. [id number]  with undesired fertility,status post vaginal delivery, desires permanent sterilization.  Other reversible forms of contraception were discussed with patient; she declines all other modalities. Risks of procedure discussed with patient including but not limited to: risk of regret, permanence of method, bleeding, infection, injury to surrounding organs and need for additional procedures.  Failure risk of 0.5-1% with increased risk of ectopic gestation if pregnancy occurs was also discussed with patient.     FINDINGS:  Normal uterus, tubes, and ovaries.  PROCEDURE DETAILS: The patient was taken to the operating room where her spinal anesthesia was dosed up to surgical level and found to be adequate.  She was then placed in a supine position and prepped and draped in the usual sterile fashion.  After an adequate timeout was performed, attention was turned to the patient's abdomen where a small transverse skin incision was made under the umbilical fold. The incision was taken down to the layer of fascia using the scalpel, and fascia was incised, and extended bilaterally. The peritoneum was entered in a sharp fashion. The patient was placed in Trendelenburg.  A moist lap pad was used to move omentum and bowel away until the left fallopian tube was identified and grasped with a Babcock clamp, and followed out to the fimbriated end.  A Filshie clip was placed on the left fallopian tube about 2 cm from the cornu.  A similar process was carried out on the right side allowing for bilateral tubal sterilization.  Good hemostasis  was noted overall.  Local analgesia was injected into both Filshie application sites.The instruments were then removed from the patient's abdomen and the fascial incision was repaired with 0 Vicryl, and the skin was closed with a 4-0 Vicryl subcuticular stitch. The patient tolerated the procedure well.  Sponge, lap, and needle counts were correct times two.  The patient was then taken to the recovery room awake, extubated and in stable condition.  [name] MD 12/05/2016 10:14 AM

## 2016-12-05 NOTE — OR Nursing (Signed)
318 PIV   Rt hand patent with LR infusing . Epidural intact , clean and covered and locked.  No pain, NKazmar,. RN

## 2016-12-05 NOTE — Progress Notes (Signed)
Got pt up to the bathroom for the first time after her BTL.  Pt complaining of pain on right side when moving rt leg.  She describes pain as "tearing" or "pulling".  Pt sliding rt leg along stating she cannot lift leg because of pain.  Pt in tears rating pain at a 10.  Dr. Frances Furbish notifed at 2:16, she stated she would come see the patient.

## 2016-12-06 DIAGNOSIS — Z9851 Tubal ligation status: Secondary | ICD-10-CM

## 2016-12-06 MED ORDER — BENZOCAINE-MENTHOL 20-0.5 % EX AERO: 1.0000 "application " | INHALATION_SPRAY | CUTANEOUS | 0 refills | Status: AC | PRN

## 2016-12-06 MED ORDER — IBUPROFEN 600 MG PO TABS: 600.0000 mg | ORAL_TABLET | Freq: Four times a day (QID) | ORAL | 0 refills | Status: AC

## 2016-12-06 NOTE — Discharge Instructions (Signed)

## 2016-12-06 NOTE — Lactation Note (Signed)
This note was copied from a baby's chart. Lactation Consultation Note  Patient Name: Renee [name] [id number] Date: 12/06/2016 Reason for consult: Follow-up assessment Mom currently feeding baby using football hold.  Baby latched well and feeding well with audible swallows.  Mom denies questions or concerns.  Lactation outpatient services and support information reviewed and encouraged prn.  Maternal Data    Feeding Feeding Type: Breast Fed  LATCH Score Latch: Grasps breast easily, tongue down, lips flanged, rhythmical sucking.  Audible Swallowing: Spontaneous and intermittent  Type of Nipple: Everted at rest and after stimulation  Comfort (Breast/Nipple): Soft / non-tender  Hold (Positioning): No assistance needed to correctly position infant at breast.  LATCH Score: 10  Interventions    Lactation Tools Discussed/Used     Consult Status      Renee [name] 12/06/2016, 8:42 AM

## 2016-12-06 NOTE — Discharge Summary (Signed)
OB Discharge Summary     Patient Name: Renee [name] DOB: 03/25/1976 MRN: [id number]  Date of admission: 12/04/2016 Delivering MD: Aviva Signs   Date of discharge: 12/06/2016  Admitting diagnosis: 40 WEEKS CTX Desires Sterilization Intrauterine pregnancy: [redacted]w[redacted]d     Secondary diagnosis:  Principal Problem:   SVD (spontaneous vaginal delivery) Active Problems:   S/P tubal ligation  Additional problems: none     Discharge diagnosis: Term Pregnancy Delivered and VBAC                                                                                                Post partum procedures:postpartum tubal ligation  Augmentation: AROM and Pitocin  Complications: None  Hospital course:  Onset of Labor With Vaginal Delivery     41 y.o. yo [id number] at [redacted]w[redacted]d was admitted in Latent Labor on 12/04/2016. Patient had an uncomplicated labor course as follows:  Membrane Rupture Time/Date: 2:00 AM ,12/04/2016   Intrapartum Procedures: Episiotomy: None [1]                                         Lacerations:  2nd degree [3]  Patient had a delivery of a Viable infant. 12/04/2016  Information for the patient's newborn:  Akacia, Uher [[id number]]  Delivery Method: VBAC, Spontaneous (Filed from Delivery Summary)    Pateint had an uncomplicated postpartum course.  She is ambulating, tolerating a regular diet, passing flatus, and urinating well. Patient is discharged home in stable condition on 12/06/16.   Physical exam  Vitals:   12/05/16 1735 12/05/16 2100 12/06/16 0100 12/06/16 0500  BP: 103/64 116/72 (!) 94/55 119/67  Pulse: 74 74 78 74  Resp: 18 18 18 18   Temp: 98 F (36.7 C) 98.2 F (36.8 C) 98 F (36.7 C) 98.3 F (36.8 C)  TempSrc: Oral Oral Oral Oral  SpO2:      Weight:      Height:       General: alert, cooperative and no distress Lochia: appropriate Uterine Fundus: firm Incision: Healing well with no significant drainage, No significant erythema, Dressing is clean, dry,  and intact DVT Evaluation: No evidence of DVT seen on physical exam. Negative Homan's sign. No cords or calf tenderness. No significant calf/ankle edema. Labs: Lab Results  Component Value Date   WBC 16.8 (H) 12/05/2016   HGB 12.4 12/05/2016   HCT 35.1 (L) 12/05/2016   MCV 93.4 12/05/2016   PLT 146 (L) 12/05/2016   CMP Latest Ref Rng & Units 01/10/2015  Glucose 65 - 99 mg/dL 315(V)  BUN 6 - 20 mg/dL 13  Creatinine 7.61 - 6.07 mg/dL 3.71  Sodium 062 - 694 mmol/L 138  Potassium 3.5 - 5.1 mmol/L 4.0  Chloride 101 - 111 mmol/L 108  CO2 22 - 32 mmol/L 25  Calcium 8.9 - 10.3 mg/dL 9.0  Total Protein 6.0 - 8.3 g/dL -  Total Bilirubin 0.2 - 1.2 mg/dL -  Alkaline Phos 39 - 854 U/L -  AST 0 -  37 U/L -  ALT 0 - 35 U/L -    Discharge instruction: per After Visit Summary and "Baby and Me Booklet".  After visit meds:  Allergies as of 12/06/2016   No Known Allergies     Medication List    TAKE these medications   acetaminophen 325 MG tablet Commonly known as:  TYLENOL Take 650 mg by mouth every 6 (six) hours as needed for moderate pain.   benzocaine-Menthol 20-0.5 % Aero Commonly known as:  DERMOPLAST Apply 1 application topically as needed for irritation (perineal discomfort).   COMFORT FIT MATERNITY SUPP LG Misc 1 Units by Does not apply route daily.   famotidine 20 MG tablet Commonly known as:  PEPCID Take 1 tablet (20 mg total) by mouth 2 (two) times daily. What changed:  when to take this  reasons to take this   ibuprofen 600 MG tablet Commonly known as:  ADVIL,MOTRIN Take 1 tablet (600 mg total) by mouth every 6 (six) hours.   multivitamin-prenatal 27-0.8 MG Tabs tablet Take 1 tablet by mouth daily at 12 noon.            Discharge Care Instructions        Start     Ordered   12/06/16 0000  benzocaine-Menthol (DERMOPLAST) 20-0.5 % AERO  As needed     12/06/16 0916   12/06/16 0000  ibuprofen (ADVIL,MOTRIN) 600 MG tablet  Every 6 hours     12/06/16  0916   12/04/16 0000  OB RESULTS CONSOLE GC/Chlamydia    Comments:  This external order was created through the Results Console.   12/04/16 0304      Diet: routine diet  Activity: Advance as tolerated. Pelvic rest for 6 weeks.   Outpatient follow up:4-6 weeks Follow up Appt: Future Appointments Date Time Provider Department Center  01/02/2017 1:45 PM Anyanwu, Jethro Bastos, MD [facility] None   Follow up Visit:No Follow-up on file.  Postpartum contraception: Tubal Ligation  Newborn Data: Live born female  Birth Weight: 9 lb 3.3 oz (4176 g) APGAR: 6, 9  Baby Feeding: Breast Disposition:home with mother   12/06/2016 Lennox Solders, MD   OB FELLOW DISCHARGE ATTESTATION  I have seen and examined this patient and agree with above documentation in the resident's note.   Frederik Pear, MD OB Fellow 9:24 AM

## 2016-12-11 ENCOUNTER — Encounter (HOSPITAL_COMMUNITY): Payer: Self-pay | Admitting: Family Medicine

## 2016-12-30 ENCOUNTER — Encounter: Payer: Self-pay | Admitting: Obstetrics & Gynecology

## 2017-01-02 ENCOUNTER — Ambulatory Visit (INDEPENDENT_AMBULATORY_CARE_PROVIDER_SITE_OTHER): Payer: Medicaid Other | Admitting: Obstetrics & Gynecology

## 2017-01-02 ENCOUNTER — Encounter: Payer: Self-pay | Admitting: Obstetrics & Gynecology

## 2017-01-02 DIAGNOSIS — Z1389 Encounter for screening for other disorder: Secondary | ICD-10-CM | POA: Diagnosis not present

## 2017-01-02 NOTE — Progress Notes (Signed)
Post Partum Exam  Renee [name] is a 41 y.o. [phone] female who presents for a postpartum visit. She is 6 weeks postpartum following a spontaneous vaginal delivery. I have fully reviewed the prenatal and intrapartum course. The delivery was at [redacted]w[redacted]d gestational weeks.  Anesthesia: epidural. Postpartum course has been unremarkable. Baby's course has been unremarkable. Baby is feeding by breast. Bleeding just spotting. Bowel function is normal. Bladder function is normal. Patient is not sexually active. Contraception method is tubal ligation. Postpartum depression screening: negative.  Patient complains nipple pain in both breasts that happens sporadically.  The following portions of the patient's history were reviewed and updated as appropriate: allergies, current medications, past family history, past medical history, past social history, past surgical history and problem list.  Review of Systems Pertinent items noted in HPI and remainder of comprehensive ROS otherwise negative.    Objective:  Blood pressure 121/76, pulse 81, temperature (!) 97.4 F (36.3 C), weight 192 lb (87.1 kg), currently breastfeeding.  General:  alert and no distress   Breasts:  inspection negative, no nipple discharge or bleeding, no masses or nodularity palpable.  Some nipple dryness noted  Lungs: clear to auscultation bilaterally  Heart:  regular rate and rhythm  Abdomen: soft, non-tender; bowel sounds normal; no masses,  no organomegaly. BTL incision is C/D/I   Pelvic:  not evaluated        Assessment:    Normal postpartum exam. Pap smear 06/21/16 - NORMAL.  Plan:   1. Contraception: tubal ligation 2. Recommended application of coconut oil to nipples as needed. 3. Follow up as needed.   Jaynie Collins, MD, FACOG Attending Obstetrician & Gynecologist, [facility] for [employer], [facility]

## 2017-12-29 ENCOUNTER — Other Ambulatory Visit: Payer: Self-pay | Admitting: Family

## 2017-12-29 DIAGNOSIS — Z1231 Encounter for screening mammogram for malignant neoplasm of breast: Secondary | ICD-10-CM

## 2018-01-28 ENCOUNTER — Ambulatory Visit: Payer: Medicaid Other

## 2018-03-05 ENCOUNTER — Ambulatory Visit
Admission: RE | Admit: 2018-03-05 | Discharge: 2018-03-05 | Disposition: A | Discharge status: Home / Self Care | Payer: Medicaid Other | Source: Ambulatory Visit | Attending: Family | Admitting: Family

## 2018-03-05 DIAGNOSIS — Z1231 Encounter for screening mammogram for malignant neoplasm of breast: Secondary | ICD-10-CM

## 2019-05-12 IMAGING — MG DIGITAL SCREENING BILATERAL MAMMOGRAM WITH TOMO AND CAD
8 series · 8 of 24 positions shown · non-contrast
Comparison: None.

CLINICAL DATA: Screening.

EXAM:
DIGITAL SCREENING BILATERAL MAMMOGRAM WITH TOMO AND CAD

[R CC synth-2D]
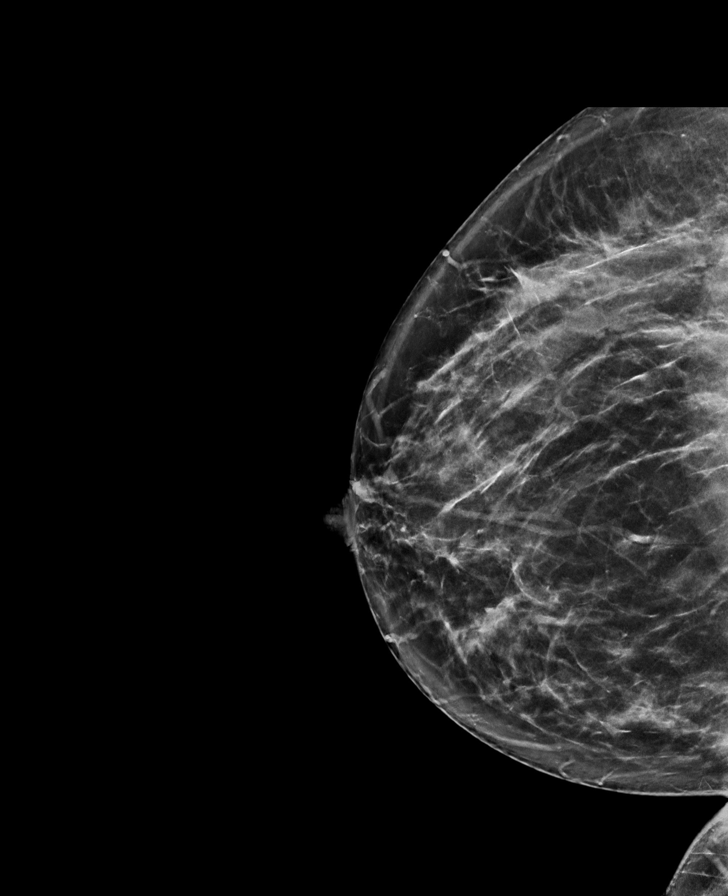

[L MLO synth-2D]
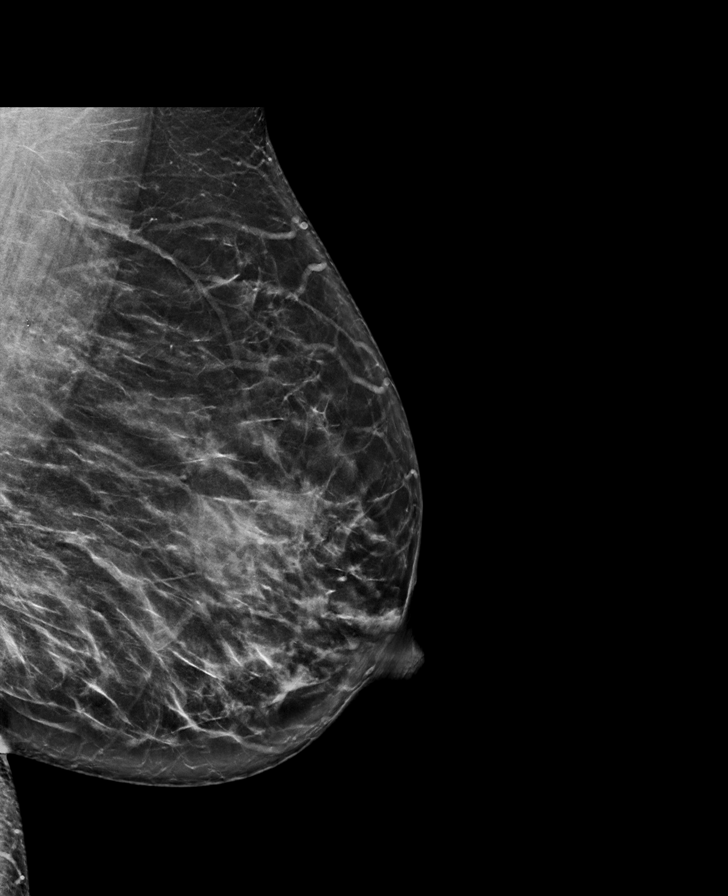

[R MLO synth-2D]
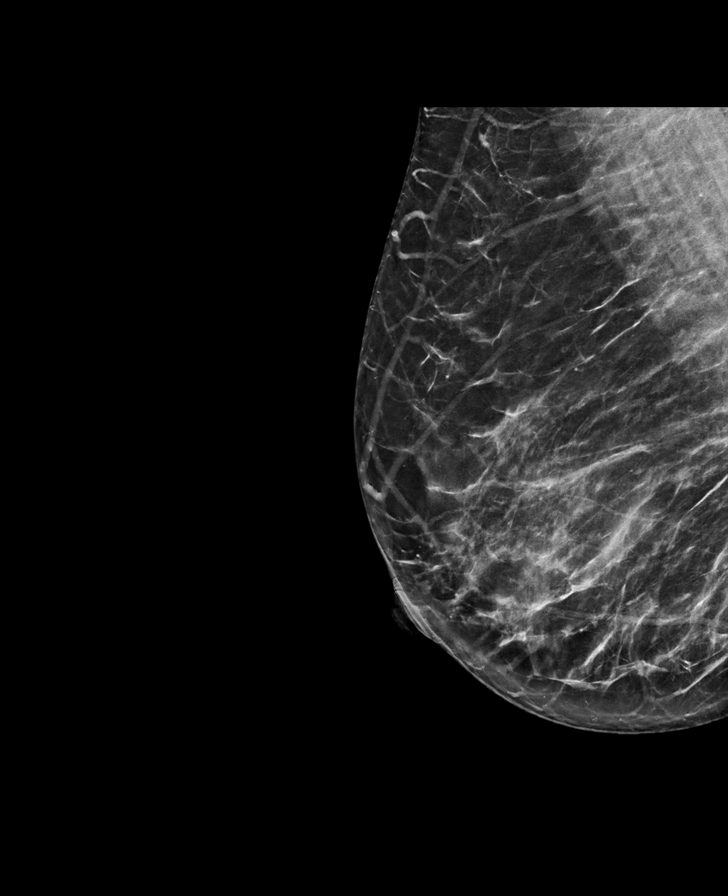

[L CC synth-2D]
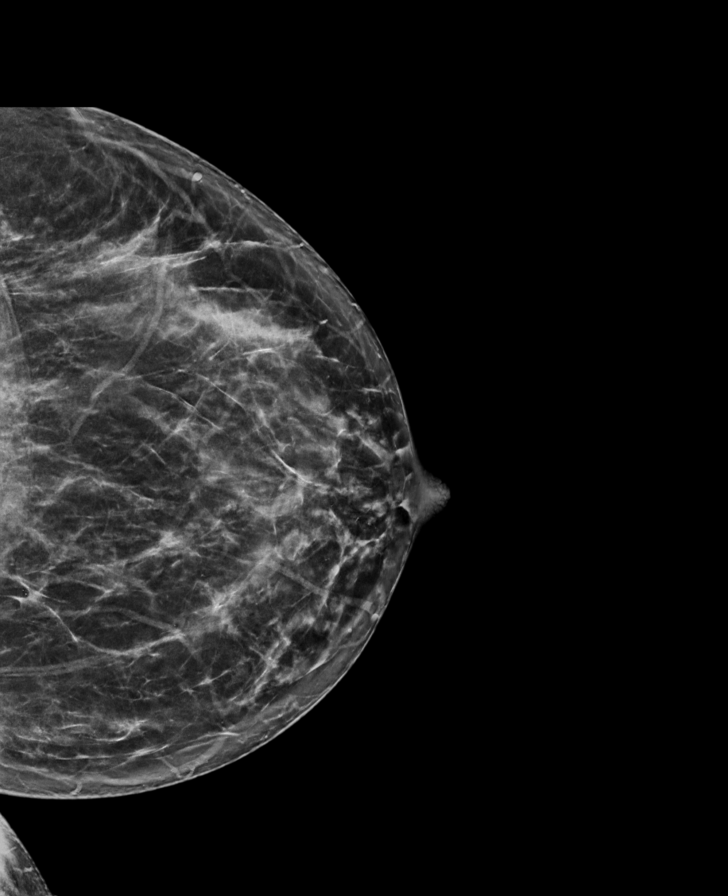

[R MLO tomo · tomo slice 39/77.0]
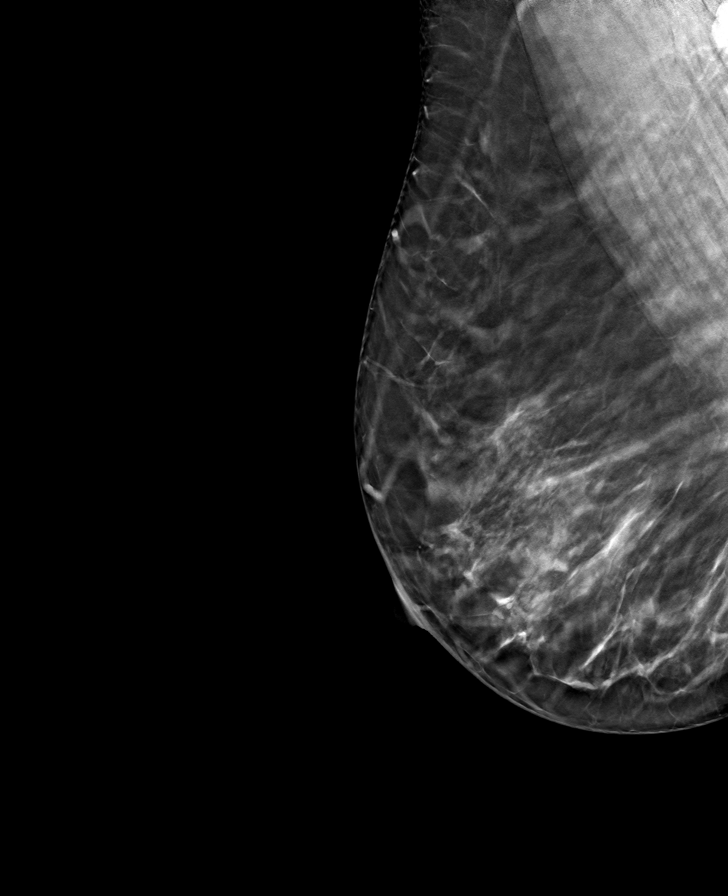

[R CC tomo · tomo slice 39/76.0]
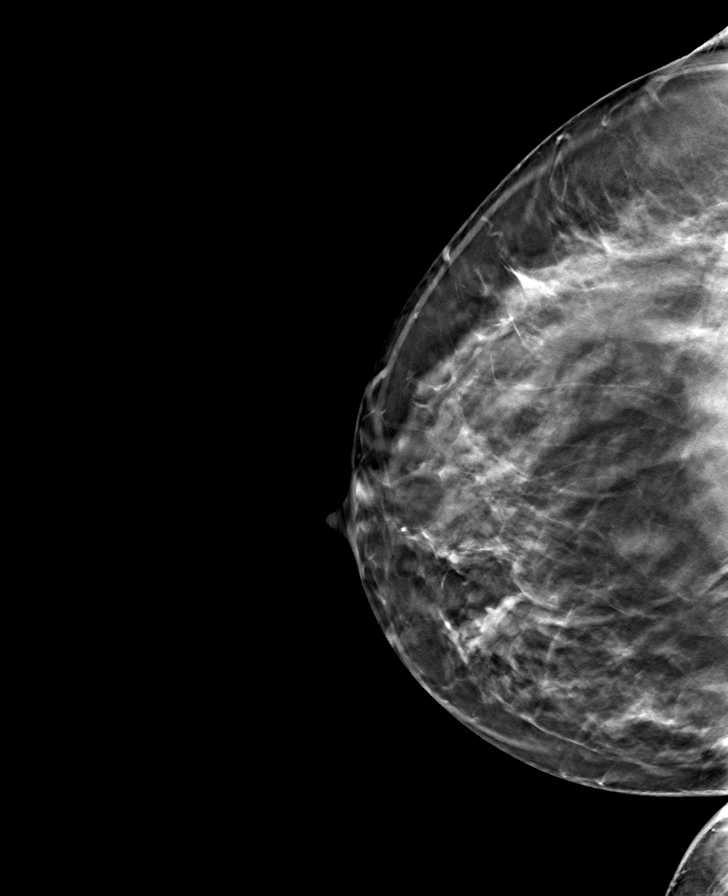

[L MLO tomo · tomo slice 41/81.0]
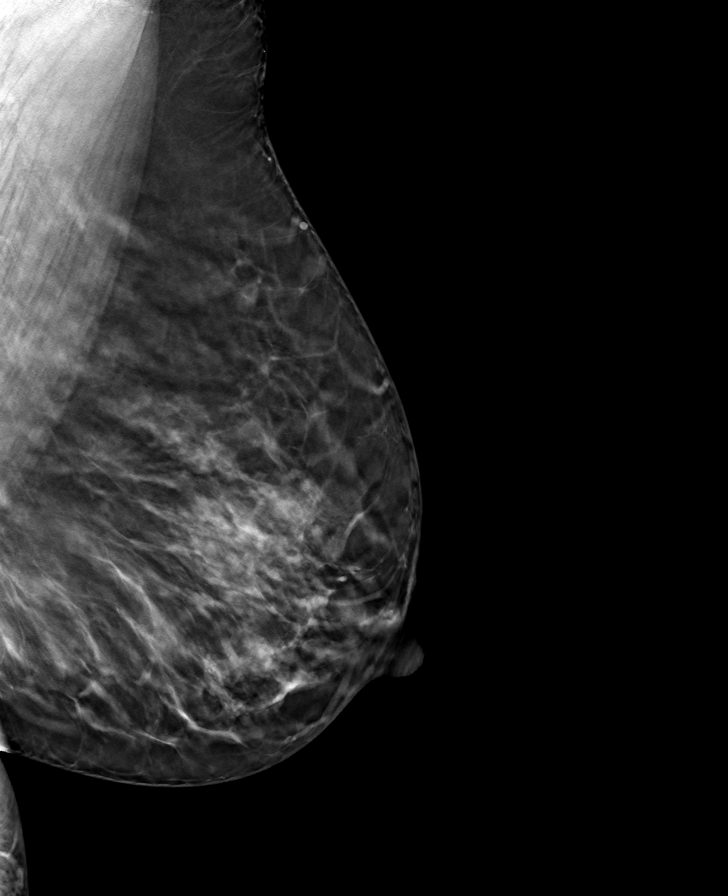

[L CC tomo · tomo slice 37/72.0]
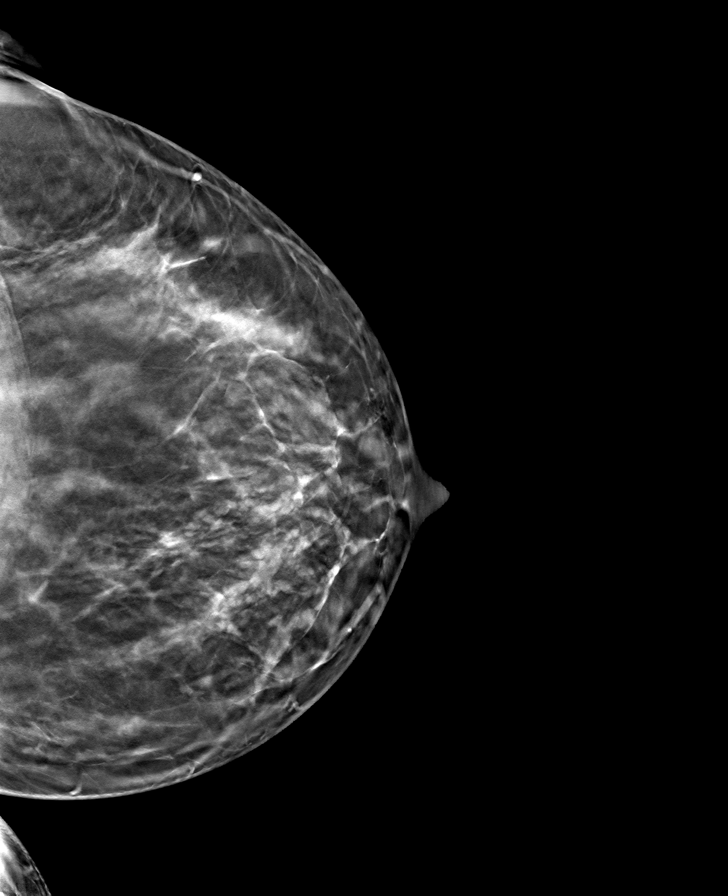

[8 of 24 positions shown; findings below may reference images not displayed]

ACR Breast Density Category c: The breast tissue is heterogeneously
dense, which may obscure small masses
FINDINGS: There are no findings suspicious for malignancy. Images were
processed with CAD.
IMPRESSION: No mammographic evidence of malignancy. A result letter of this
screening mammogram will be mailed directly to the patient.

RECOMMENDATION:
Screening mammogram in one year. (Code:EM-2-IHY)

BI-RADS CATEGORY  1: Negative.
